# Patient Record
Sex: Female | Born: 1955 | Race: Black or African American | Hispanic: No | Smoking: Never smoker
Health system: Southern US, Community
[De-identification: ages and names within clinical notes are randomized; demographics above are authoritative.]

## PROBLEM LIST (undated history)

## (undated) ENCOUNTER — Emergency Department (HOSPITAL_BASED_OUTPATIENT_CLINIC_OR_DEPARTMENT_OTHER): Admission: EM | Payer: No Typology Code available for payment source | Source: Home / Self Care

## (undated) DIAGNOSIS — E7800 Pure hypercholesterolemia, unspecified: Secondary | ICD-10-CM

## (undated) DIAGNOSIS — I10 Essential (primary) hypertension: Secondary | ICD-10-CM

## (undated) HISTORY — PX: ABDOMINAL HYSTERECTOMY: SHX81

## (undated) HISTORY — PX: CHOLECYSTECTOMY: SHX55

---

## 2015-02-19 ENCOUNTER — Encounter (HOSPITAL_BASED_OUTPATIENT_CLINIC_OR_DEPARTMENT_OTHER): Payer: Self-pay

## 2015-02-19 ENCOUNTER — Emergency Department (HOSPITAL_BASED_OUTPATIENT_CLINIC_OR_DEPARTMENT_OTHER)
Admission: EM | Admit: 2015-02-19 | Discharge: 2015-02-19 | Disposition: A | Discharge status: Home / Self Care | Payer: Medicaid Other | Attending: Emergency Medicine | Admitting: Emergency Medicine

## 2015-02-19 DIAGNOSIS — I10 Essential (primary) hypertension: Secondary | ICD-10-CM

## 2015-02-19 DIAGNOSIS — R51 Headache: Secondary | ICD-10-CM | POA: Diagnosis not present

## 2015-02-19 DIAGNOSIS — Z8639 Personal history of other endocrine, nutritional and metabolic disease: Secondary | ICD-10-CM | POA: Diagnosis not present

## 2015-02-19 HISTORY — DX: Pure hypercholesterolemia, unspecified: E78.00

## 2015-02-19 MED ORDER — LORAZEPAM 1 MG PO TABS
0.5000 mg | ORAL_TABLET | Freq: Once | ORAL | Status: AC
  Administered 2015-02-19: 0.5 mg via ORAL
  Filled 2015-02-19: qty 1

## 2015-02-19 NOTE — ED Provider Notes (Signed)
CSN: [id number]     Arrival date & time 02/19/15  1407 History   First MD Initiated Contact with Patient 02/19/15 1415     Chief Complaint  Patient presents with  . Hypertension     (Consider location/radiation/quality/duration/timing/severity/associated sxs/prior Treatment) The history is provided by the patient and medical records. No language interpreter was used.     Kayla Haynes is a 59 y.o. female  with a hx of high cholesterol presents to the Emergency Department complaining of waxing and waning headache onset 3 days ago.  Pt reports she also had a tight weave in which she took out yesterday but her headache was present this morning. Pt reports headache resolves after taking ibuprofen but returns after the medication wears off.  Pt describes the headache as a tight band around her head. She reports pain is a 0/10 at this time.  She reports when she took her SBP at home it was 227.  Pt reports her medical care is covered at the [place].  She reports she has had HTN on and off for several years.  Nothing makes it better or worse. Pt reports increased stress at home and drinks 2 [place] dew soft drinks per day.   Pt reports attempting to watch her sodium.  She does report eating a fair amount of salted nuts.  Pt denies fever, chills, neck pain, chest pain, SOB, abd pain, N/V/D, weakness, dizziness, syncope, dysuria, hematuria.    Pt reports lab work from the [place] on 02/06/15:  Cholesterol total - 294 Triglycerides - 223 HDL - 40 C-LDL - 209  WBC - 8.34  RBC - 5.32 hgb - 12.4 hct - 39.3 mcv - 73.9 Mch - 23.3 Mchc - 31.6 plt - 357  NA - 142 k - 3.5 CL - 105 Co2 - 26 Ca - 9.5 BUN - 11 Creat - 0.872 EGFR - >60 Glucose - 102 Albumin - 3.8  ALK phos - 109 ALT 16 AST - 11 Dir Bili - <0.1 Bili total - 0.3    Past Medical History  Diagnosis Date  . High cholesterol    Past Surgical History  Procedure Laterality Date  . Abdominal hysterectomy    . Cholecystectomy      No family history on file. Social History  Substance Use Topics  . Smoking status: Never Smoker   . Smokeless tobacco: None  . Alcohol Use: Yes     Comment: rare   OB History    No data available     Review of Systems  Constitutional: Negative for fever, diaphoresis, appetite change, fatigue and unexpected weight change.  HENT: Negative for mouth sores.   Eyes: Negative for visual disturbance.  Respiratory: Negative for cough, chest tightness, shortness of breath and wheezing.   Cardiovascular: Negative for chest pain.  Gastrointestinal: Negative for nausea, vomiting, abdominal pain, diarrhea and constipation.  Endocrine: Negative for polydipsia, polyphagia and polyuria.  Genitourinary: Negative for dysuria, urgency, frequency and hematuria.  Musculoskeletal: Negative for back pain and neck stiffness.  Skin: Negative for rash.  Allergic/Immunologic: Negative for immunocompromised state.  Neurological: Positive for headaches (intermittent). Negative for syncope and light-headedness.  Hematological: Does not bruise/bleed easily.  Psychiatric/Behavioral: Negative for sleep disturbance. The patient is not nervous/anxious.       Allergies  Review of patient's allergies indicates no known allergies.  Home Medications   Prior to Admission medications   Not on File   BP 192/82 mmHg  Pulse 65  Temp(Src) 98.4 F (36.9  C) (Oral)  Resp 18  Ht 5' 3" (1.6 m)  Wt 148 lb (67.132 kg)  BMI 26.22 kg/m2  SpO2 98% Physical Exam  Constitutional: She is oriented to person, place, and time. She appears well-developed and well-nourished. No distress.  HENT:  Head: Normocephalic and atraumatic.  Mouth/Throat: Oropharynx is clear and moist.  Eyes: Conjunctivae and EOM are normal. Pupils are equal, round, and reactive to light. No scleral icterus.  No horizontal, vertical or rotational nystagmus  Neck: Normal range of motion. Neck supple.  Full active and passive ROM without pain No  midline or paraspinal tenderness No nuchal rigidity or meningeal signs  Cardiovascular: Normal rate, regular rhythm, normal heart sounds and intact distal pulses.   No murmur heard. Pulmonary/Chest: Effort normal and breath sounds normal. No respiratory distress. She has no wheezes. She has no rales.  Abdominal: Soft. Bowel sounds are normal. There is no tenderness. There is no rebound and no guarding.  Musculoskeletal: Normal range of motion.  Lymphadenopathy:    She has no cervical adenopathy.  Neurological: She is alert and oriented to person, place, and time. She has normal reflexes. No cranial nerve deficit. She exhibits normal muscle tone. Coordination normal.  Mental Status:  Alert, oriented, thought content appropriate. Speech fluent without evidence of aphasia. Able to follow 2 step commands without difficulty.  Cranial Nerves:  II:  Peripheral visual fields grossly normal, pupils equal, round, reactive to light III,IV, VI: ptosis not present, extra-ocular motions intact bilaterally  V,VII: smile symmetric, facial light touch sensation equal VIII: hearing grossly normal bilaterally  IX,X: midline uvula rise  XI: bilateral shoulder shrug equal and strong XII: midline tongue extension  Motor:  5/5 in upper and lower extremities bilaterally including strong and equal grip strength and dorsiflexion/plantar flexion Sensory: Pinprick and light touch normal in all extremities.  Deep Tendon Reflexes: 2+ and symmetric  Cerebellar: normal finger-to-nose with bilateral upper extremities Gait: normal gait and balance CV: distal pulses palpable throughout   Skin: Skin is warm and dry. No rash noted. She is not diaphoretic.  Psychiatric: She has a normal mood and affect. Her behavior is normal. Judgment and thought content normal.  Nursing note and vitals reviewed.   ED Course  Procedures (including critical care time) Labs Review Labs Reviewed - No data to display  Imaging Review No  results found. I have personally reviewed and evaluated these images and lab results as part of my medical decision-making.   EKG Interpretation None      MDM   Final diagnoses:  Essential hypertension   Allyana Scarberry presents with c/o HTN.  Pt reports intermittent headaches, gradual in nature and without thunderclap, worse headache of her life, vision changes, numbness, tingling or other neurologic symptoms. Patient remains headache free. No concern for subarachnoid hemorrhage.  Patient also without chest pain, shortness of breath, nausea or vomiting.  [facility] without evidence of endorgan damage or that time.  This time I do not feel that further workup including imaging or CT scan would be beneficial. Discussed at length with sodium diet, regular exercise and recheck with primary care within 3 days.  The patient was discussed with Dr. Liu who agrees with the treatment plan and d/c home without labs and imaging.    3:32 PM Patient blood pressure significantly improved after my discussion with her and documented at 164/92. On recheck patient nurse in the room checking manual blood pressures noted at 192/82. Patient   is clearly anxious. She reports she is very nervous about her blood pressure but continues to feel normal. Will give Ativan and reassess.  She remains without evidence of options of urgency or emergency.   4:13 PM Repeat BP 165/86.  Pt reports he is feeling better.  She will follow with Dr. Barbaraann Barthel for further evaluation.  She remains without chest pain, shortness of breath, headache or neurologic symptoms.    Jarrett Soho Islah Eve, PA-C 02/19/15 [place] Liu, MD 02/19/15 2000

## 2015-02-19 NOTE — ED Notes (Signed)
C/o elevated BP x today-states she checks it approx 1 month-states [facility]-was at the [place] 2 days ago-states she not been dx with HTN and no meds

## 2015-02-19 NOTE — Discharge Instructions (Signed)
1. Medications: usual home medications 2. Treatment: rest, drink plenty of fluids, decrease your salt intake, stop drinking mountain dew 3. Follow Up: Please followup with your primary doctor in 3 days for discussion of your diagnoses and further evaluation after today's visit; if you do not have a primary care doctor use the resource guide provided to find one; Please return to the ER for sudden onset headaches, vision changes, chest pain, shortness of breath or other concerning symptoms.   Hypertension Hypertension, commonly called high blood pressure, is when the force of blood pumping through your arteries is too strong. Your arteries are the blood vessels that carry blood from your heart throughout your body. A blood pressure reading consists of a higher number over a lower number, such as 110/72. The higher number (systolic) is the pressure inside your arteries when your heart pumps. The lower number (diastolic) is the pressure inside your arteries when your heart relaxes. Ideally you want your blood pressure below 120/80. Hypertension forces your heart to work harder to pump blood. Your arteries may become narrow or stiff. Having untreated or uncontrolled hypertension can cause heart attack, stroke, kidney disease, and other problems. RISK FACTORS Some risk factors for high blood pressure are controllable. Others are not.  Risk factors you cannot control include:   Race. You may be at higher risk if you are African American.  Age. Risk increases with age.  Gender. Men are at higher risk than women before age 21 years. After age 27, women are at higher risk than men. Risk factors you can control include:  Not getting enough exercise or physical activity.  Being overweight.  Getting too much fat, sugar, calories, or salt in your diet.  Drinking too much alcohol. SIGNS AND SYMPTOMS Hypertension does not usually cause signs or symptoms. Extremely high blood pressure (hypertensive  crisis) may cause headache, anxiety, shortness of breath, and nosebleed. DIAGNOSIS To check if you have hypertension, your health care provider will measure your blood pressure while you are seated, with your arm held at the level of your heart. It should be measured at least twice using the same arm. Certain conditions can cause a difference in blood pressure between your right and left arms. A blood pressure reading that is higher than normal on one occasion does not mean that you need treatment. If it is not clear whether you have high blood pressure, you may be asked to return on a different day to have your blood pressure checked again. Or, you may be asked to monitor your blood pressure at home for 1 or more weeks. TREATMENT Treating high blood pressure includes making lifestyle changes and possibly taking medicine. Living a healthy lifestyle can help lower high blood pressure. You may need to change some of your habits. Lifestyle changes may include:  Following the DASH diet. This diet is high in fruits, vegetables, and whole grains. It is low in salt, red meat, and added sugars.  Keep your sodium intake below 2,300 mg per day.  Getting at least 30-45 minutes of aerobic exercise at least 4 times per week.  Losing weight if necessary.  Not smoking.  Limiting alcoholic beverages.  Learning ways to reduce stress. Your health care provider may prescribe medicine if lifestyle changes are not enough to get your blood pressure under control, and if one of the following is true:  You are 28-61 years of age and your systolic blood pressure is above 140.  You are 58 years of age  or older, and your systolic blood pressure is above 150.  Your diastolic blood pressure is above 90.  You have diabetes, and your systolic blood pressure is over 637 or your diastolic blood pressure is over 90.  You have kidney disease and your blood pressure is above 140/90.  You have heart disease and your  blood pressure is above 140/90. Your personal target blood pressure may vary depending on your medical conditions, your age, and other factors. HOME CARE INSTRUCTIONS  Have your blood pressure rechecked as directed by your health care provider.   Take medicines only as directed by your health care provider. Follow the directions carefully. Blood pressure medicines must be taken as prescribed. The medicine does not work as well when you skip doses. Skipping doses also puts you at risk for problems.  Do not smoke.   Monitor your blood pressure at home as directed by your health care provider. SEEK MEDICAL CARE IF:   You think you are having a reaction to medicines taken.  You have recurrent headaches or feel dizzy.  You have swelling in your ankles.  You have trouble with your vision. SEEK IMMEDIATE MEDICAL CARE IF:  You develop a severe headache or confusion.  You have unusual weakness, numbness, or feel faint.  You have severe chest or abdominal pain.  You vomit repeatedly.  You have trouble breathing. MAKE SURE YOU:   Understand these instructions.  Will watch your condition.  Will get help right away if you are not doing well or get worse.   This information is not intended to replace advice given to you by your health care provider. Make sure you discuss any questions you have with your health care provider.   Document Released: 03/29/2005 Document Revised: 08/13/2014 Document Reviewed: 01/19/2013 Elsevier Interactive Patient Education 2016 [employer].    Emergency Department Resource Guide 1) Find a Doctor and Pay Out of Pocket Although you won't have to find out who is covered by your insurance plan, it is a good idea to ask around and get recommendations. You will then need to call the office and see if the doctor you have chosen will accept you as a new patient and what types of options they offer for patients who are self-pay. Some doctors offer discounts  or will set up payment plans for their patients who do not have insurance, but you will need to ask so you aren't surprised when you get to your appointment.  2) Contact Your Local Health Department Not all health departments have doctors that can see patients for sick visits, but many do, so it is worth a call to see if yours does. If you don't know where your local health department is, you can check in your phone book. The CDC also has a tool to help you locate your state's health department, and many state websites also have listings of all of their local health departments.  3) Find a [facility] If your illness is not likely to be very severe or complicated, you may want to try a walk in clinic. These are popping up all over the country in pharmacies, drugstores, and shopping centers. They're usually staffed by nurse practitioners or physician assistants that have been trained to treat common illnesses and complaints. They're usually fairly quick and inexpensive. However, if you have serious medical issues or chronic medical problems, these are probably not your best option.  No Primary Care Doctor: - Call Health Connect at  [phone] -  they can help you locate a primary care doctor that  accepts your insurance, provides certain services, etc. - Physician Referral Service- [phone]  Chronic Pain Problems: Organization         Address  Phone   Notes  [facility]  [phone] Patients need to be referred by their primary care doctor.   Medication Assistance: Organization         Address  Phone   Notes  [facility] Medication [facility] [address] [place] [address] [place], [place] [address] [phone] --Must be a resident of [facility] -- Must have NO insurance coverage whatsoever (no Medicaid/ Medicare, etc.) -- The pt. MUST have a primary care doctor that directs their care regularly and follows them in the community   [facility]ist  [phone]   [employer]  [phone]    Agencies that provide inexpensive medical care: Organization         Address  Phone   Notes  [place]  [phone]   [facility]    [phone]   [facility] [place], [place] [address] [phone]   [place] [address], [place] [phone]   Planned Parenthood    [phone]   [facility]    [phone]   [place] and [place] [address], [place] Phone:  [phone], Fax:  [phone] Hours of Operation:  9 am - 6 pm, M-F.  Also accepts Medicaid/Medicare and self-pay.  [facility] [place], [address], [place] Phone: [phone], Fax: [phone]. Hours of Operation:  8:30 am - 5:30 pm, M-F.  Also accepts Medicaid and self-pay.  [facility] [address], [place] Phone: [phone]   [place], [place], [place] [phone], Ext. [phone] Mondays & Thursdays: 7-9 AM.  First 15 patients are seen on a first come, first serve basis.    [place] Providers:  Organization         Address  Phone   Notes  [facility] [address], [place] [phone] Also accepts self-pay patients.  [facility] [address], [place]  [phone]   [place], [address], [place] [phone]   [facility] [address] [place] [phone]   Lucianne Lei [address], [place]   [phone] Only accepts [place] Access [place] patients after they have their name applied to their card.   Self-Pay (no insurance) in [facility]:  Organization         Address  Phone   Notes  Sickle Cell Patients, [facility] [place] [phone]   [facility] [place] [phone]   [facility] [place]  [place], [place],  [phone]   [facility]/Dr. Osei-Bonsu  [address], [place] or [address], [place] [phone] Phone number for both [place] and [place] locations is the same.  [facility] [address], [place] [phone]   [facility] [place] or [place]  [facility] [address] [phone]   [facility] [place] [phone], phone; [phone], fax Sees patients 1st and 3rd Saturday of every month.  Must not qualify for public or private insurance (i.e. Medicaid, Medicare, [place] Health Choice, Veterans' Benefits)  Household income should be no more than 200% of the poverty level The clinic cannot treat you if you are pregnant or think you are pregnant  Sexually transmitted diseases are not treated at the clinic.    Dental Care: Organization         Address  Phone  Notes  [facility] Department of [facility] [place] [phone] Accepts children up to age 27 who are enrolled in [place] or [place]; pregnant women with a Medicaid card; and children who have applied for Medicaid or [place] Health Choice, but were declined, whose parents can pay a reduced fee at time of service.  [facility] Department of [facility]  [address], [place] [phone] Accepts children up to age 31 who are enrolled in [place] or [place]; pregnant women with a Medicaid card; and children who have applied for Medicaid or [place] Health Choice, but were declined, whose parents can pay a reduced fee at time of service.  [facility] Adult [facility] Access PROGRAM  [place] [phone] Patients are seen by appointment only. Walk-ins are not accepted. [place] will see patients 73 years of age and older. Monday - Tuesday (8am-5pm) Most Wednesdays (8:30-5pm) $30 per visit, cash only  [facility] Adult Dental Access PROGRAM  [address], [facility] [phone] Patients are seen by appointment only. Walk-ins are not accepted. [place] will see patients 76 years of age and older. One Wednesday Evening (Monthly: Volunteer Based).  $30 per visit, cash only  [place]  [phone] for adults; Children under age 25, call Graduate Pediatric Dentistry at [phone]. Children aged 45-14, please call [phone] to request a pediatric application.  Dental services are provided in all areas of dental care including fillings, crowns and bridges, complete and partial dentures, implants, gum treatment, root canals, and extractions. Preventive care is also provided. Treatment is provided to both adults and children. Patients are selected via a lottery and there is often a waiting list.   [facility] [address] [place]  [phone] [email or web address]   [facility] [address] [place], [place] [phone], Ext. [phone] Second and Fourth Thursday of each month, opens at 6:30 AM; Clinic ends at 9 AM.  Patients are seen on a first-come first-served basis, and a limited number are seen during each clinic.   [facility]  [address] Hillard Danker [place], [place] [phone]   Eligibility Requirements You must have lived in [place], [place], or [place] counties for at least the last three months.   You cannot be eligible for state or federal sponsored [employer], including [employer], [place], or [employer].   You generally cannot be eligible for healthcare insurance through your employer.    How to apply: Eligibility screenings are held every Tuesday and Wednesday afternoon  from 1:00 pm until 4:00 pm. You do not need an appointment for the interview!  [facility] [address], [place], [place]   [place]  [place]  Health Department  [place]  [phone]    Behavioral Health Resources in the Community: Intensive Outpatient Programs Organization         Address  Phone  Notes  [facility] [place]. [address] [place], [place] [phone]   [facility] Outpatient [address], [place], [place]   ADS: Alcohol & Drug Svcs [address] [place], [place]   [place] [address],  [place], [place] or [phone]   Substance Abuse Resources Organization         Address  Phone  Notes  Alcohol and Drug Services  [phone]   [place]  [phone]   [facility] [place]   Chinita Pester  [phone]   Residential & Outpatient Substance Abuse Program  [phone]   Psychological Services Organization         Address  Phone  Notes  [facility]  [place]  [phone]   [place] [address] [place] or [phone]    Mobile Crisis Teams Organization         Address  Phone  Notes  [facility], Mobile Crisis Care Unit  [phone]   [facility]  [address] [place], [place]   [place] [address] [place] [phone]    Self-Help/Support Groups Organization         Address  Phone             Notes  [place]. of [place] - variety of support groups  [place] Call for more information  Narcotics Anonymous (NA), [facility]aring [facility] [address], [employer] [place]  2 meetings at this location   Materials engineer         Address  Phone  Notes  [facility] [place],    [place]  [phone]   [facility]  [address], [place] [address], [place], [place]   [place], [place] [phone] Admissions: 8am-3pm M-F  Incentives Substance [place] [address]    [place], [place] [phone]   [facility] [address] [place], [place], [place]   [facility] [address],  [place], [place]   [facility] Programs - Intensive Outpatient [address] Kristeen [address], [place], [place]   [facility] ([place]  [place], [place] [phone] or [phone]   [facility] Services (RTS) [address], [place], [place] Accepts Medicaid  [facility] [place] [address]  [place] [phone] Substance Abuse/Addiction Treatment   [facility] Organization         Address  Phone  Notes  [facility]  [phone]   Domenic Schwab, PhD [address] Arlis Porta [place], [place]   [phone] or [phone]   [place], [place] [phone]   [place] [address], [place], [place] [phone] Insurance/Medicaid/sponsorship through [employer] and Families [address] [place] [address]  [place], [place] [phone] [place], [place] [phone]    Dr. Adele Schilder  [phone]   [facility] Dept. 1) [address] [place] 2) [place] 3)  [address], [place] [phone]  [phone]   [place] [phone] or [phone] (After Hours)

## 2017-09-01 ENCOUNTER — Other Ambulatory Visit: Payer: Self-pay

## 2017-09-01 ENCOUNTER — Emergency Department (HOSPITAL_BASED_OUTPATIENT_CLINIC_OR_DEPARTMENT_OTHER)
Admission: EM | Admit: 2017-09-01 | Discharge: 2017-09-02 | Disposition: A | Discharge status: Home / Self Care | Payer: Medicaid Other | Attending: Emergency Medicine | Admitting: Emergency Medicine

## 2017-09-01 ENCOUNTER — Encounter (HOSPITAL_BASED_OUTPATIENT_CLINIC_OR_DEPARTMENT_OTHER): Payer: Self-pay

## 2017-09-01 ENCOUNTER — Emergency Department (HOSPITAL_BASED_OUTPATIENT_CLINIC_OR_DEPARTMENT_OTHER): Payer: Medicaid Other

## 2017-09-01 DIAGNOSIS — R197 Diarrhea, unspecified: Secondary | ICD-10-CM | POA: Diagnosis present

## 2017-09-01 DIAGNOSIS — I10 Essential (primary) hypertension: Secondary | ICD-10-CM | POA: Diagnosis not present

## 2017-09-01 DIAGNOSIS — K529 Noninfective gastroenteritis and colitis, unspecified: Secondary | ICD-10-CM

## 2017-09-01 HISTORY — DX: Essential (primary) hypertension: I10

## 2017-09-01 LAB — COMPREHENSIVE METABOLIC PANEL
ALK PHOS: 87 U/L (ref 38–126)
ALT: 22 U/L (ref 14–54)
ANION GAP: 15 (ref 5–15)
AST: 28 U/L (ref 15–41)
Albumin: 4.8 g/dL (ref 3.5–5.0)
BILIRUBIN TOTAL: 0.6 mg/dL (ref 0.3–1.2)
BUN: 14 mg/dL (ref 6–20)
CALCIUM: 9.9 mg/dL (ref 8.9–10.3)
CO2: 21 mmol/L — ABNORMAL LOW (ref 22–32)
CREATININE: 0.84 mg/dL (ref 0.44–1.00)
Chloride: 99 mmol/L — ABNORMAL LOW (ref 101–111)
Glucose, Bld: 200 mg/dL — ABNORMAL HIGH (ref 65–99)
Potassium: 3.6 mmol/L (ref 3.5–5.1)
Sodium: 135 mmol/L (ref 135–145)
TOTAL PROTEIN: 8.6 g/dL — AB (ref 6.5–8.1)

## 2017-09-01 LAB — CBC
HCT: 35.9 % — ABNORMAL LOW (ref 36.0–46.0)
Hemoglobin: 12.5 g/dL (ref 12.0–15.0)
MCH: 24.8 pg — AB (ref 26.0–34.0)
MCHC: 34.8 g/dL (ref 30.0–36.0)
MCV: 71.2 fL — ABNORMAL LOW (ref 78.0–100.0)
PLATELETS: 361 10*3/uL (ref 150–400)
RBC: 5.04 MIL/uL (ref 3.87–5.11)
RDW: 14.2 % (ref 11.5–15.5)
WBC: 8 10*3/uL (ref 4.0–10.5)

## 2017-09-01 LAB — URINALYSIS, MICROSCOPIC (REFLEX)

## 2017-09-01 LAB — URINALYSIS, ROUTINE W REFLEX MICROSCOPIC
Glucose, UA: NEGATIVE mg/dL
Ketones, ur: 15 mg/dL — AB
NITRITE: NEGATIVE
PROTEIN: 100 mg/dL — AB
Specific Gravity, Urine: >1.03 — ABNORMAL HIGH (ref 1.005–1.030)
pH: 6 (ref 5.0–8.0)

## 2017-09-01 LAB — LIPASE, BLOOD: Lipase: 26 U/L (ref 11–51)

## 2017-09-01 MED ORDER — PROMETHAZINE HCL 25 MG/ML IJ SOLN
12.5000 mg | Freq: Once | INTRAMUSCULAR | Status: AC
  Administered 2017-09-01: 12.5 mg via INTRAVENOUS
  Filled 2017-09-01: qty 1

## 2017-09-01 MED ORDER — SODIUM CHLORIDE 0.9 % IV BOLUS
1000.0000 mL | Freq: Once | INTRAVENOUS | Status: AC
  Administered 2017-09-01: 1000 mL via INTRAVENOUS

## 2017-09-01 MED ORDER — FENTANYL CITRATE (PF) 100 MCG/2ML IJ SOLN
50.0000 ug | Freq: Once | INTRAMUSCULAR | Status: AC
  Administered 2017-09-01: 50 ug via INTRAVENOUS
  Filled 2017-09-01: qty 2

## 2017-09-01 MED ORDER — ONDANSETRON HCL 4 MG/2ML IJ SOLN
4.0000 mg | Freq: Once | INTRAMUSCULAR | Status: AC | PRN
  Administered 2017-09-01: 4 mg via INTRAVENOUS
  Filled 2017-09-01: qty 2

## 2017-09-01 MED ORDER — GI COCKTAIL ~~LOC~~
30.0000 mL | Freq: Once | ORAL | Status: AC
  Administered 2017-09-01: 30 mL via ORAL
  Filled 2017-09-01: qty 30

## 2017-09-01 NOTE — ED Notes (Signed)
Attempted x 1 to obtain IV access; unable to obtain.

## 2017-09-01 NOTE — ED Triage Notes (Signed)
Pt c/o diarrhea after eating shrimp last night

## 2017-09-01 NOTE — ED Provider Notes (Signed)
[facility] EMERGENCY DEPARTMENT Provider Note   CSN: [id number] Arrival date & time: 09/01/17  1754     History   Chief Complaint Chief Complaint  Patient presents with  . Diarrhea    HPI Kayla Haynes is a 62 y.o. female.  Patient states she developed nausea, vomiting, and diarrhea after consuming shrimp last night. She is complaining of diffuse, non-localized abdominal pain and cramping. Frequent episodes of diarrhea and vomiting.   Abdominal Pain   This is a new problem. The current episode started 12 to 24 hours ago. The problem occurs constantly. The problem has been gradually worsening. The pain is associated with suspicious food intake. The pain is located in the generalized abdominal region. The pain is moderate. Associated symptoms include belching, diarrhea, nausea and vomiting. Pertinent negatives include fever.    Past Medical History:  Diagnosis Date  . High cholesterol   . Hypertension     There are no active problems to display for this patient.   Past Surgical History:  Procedure Laterality Date  . ABDOMINAL HYSTERECTOMY    . CHOLECYSTECTOMY       OB History   None      Home Medications    Prior to Admission medications   Not on File    Family History No family history on file.  Social History Social History   Tobacco Use  . Smoking status: Never Smoker  Substance Use Topics  . Alcohol use: Yes    Comment: rare  . Drug use: Yes    Types: Marijuana     Allergies   Patient has no known allergies.   Review of Systems Review of Systems  Constitutional: Negative for fever.  Gastrointestinal: Positive for abdominal pain, diarrhea, nausea and vomiting.  All other systems reviewed and are negative.    Physical Exam Updated Vital Signs BP (!) 148/118 (BP Location: Left Arm)   Pulse 82   Temp 98.3 F (36.8 C) (Oral)   Resp 20   Ht 5\' 3"  (1.6 m)   Wt 53.6 kg (118 lb 1.6 oz)   SpO2 100%   BMI 20.92 kg/m    Physical Exam  Constitutional: She is oriented to person, place, and time. She appears well-developed and well-nourished. She appears distressed.  HENT:  Head: Normocephalic.  Eyes: Conjunctivae are normal.  Neck: Neck supple.  Cardiovascular: Normal rate and regular rhythm.  Pulmonary/Chest: Effort normal and breath sounds normal.  Abdominal: Soft. There is tenderness.  Musculoskeletal: Normal range of motion.  Neurological: She is alert and oriented to person, place, and time.  Skin: Skin is warm and dry.  Psychiatric: She has a normal mood and affect.  Nursing note and vitals reviewed.    ED Treatments / Results  Labs (all labs ordered are listed, but only abnormal results are displayed) Labs Reviewed  COMPREHENSIVE METABOLIC PANEL - Abnormal; Notable for the following components:      Result Value   Chloride 99 (*)    CO2 21 (*)    Glucose, Bld 200 (*)    Total Protein 8.6 (*)    All other components within normal limits  CBC - Abnormal; Notable for the following components:   HCT 35.9 (*)    MCV 71.2 (*)    MCH 24.8 (*)    All other components within normal limits  URINALYSIS, ROUTINE W REFLEX MICROSCOPIC - Abnormal; Notable for the following components:   APPearance CLOUDY (*)    Specific Gravity, Urine >1.030 (*)  Hgb urine dipstick SMALL (*)    Bilirubin Urine SMALL (*)    Ketones, ur 15 (*)    Protein, ur 100 (*)    Leukocytes, UA SMALL (*)    All other components within normal limits  URINALYSIS, MICROSCOPIC (REFLEX) - Abnormal; Notable for the following components:   Bacteria, UA FEW (*)    All other components within normal limits  LIPASE, BLOOD    EKG None  Radiology No results found.  Procedures Procedures (including critical care time)  Medications Ordered in ED Medications  sodium chloride 0.9 % bolus 1,000 mL (has no administration in time range)  promethazine (PHENERGAN) injection 12.5 mg (has no administration in time range)   ondansetron (ZOFRAN) injection 4 mg (4 mg Intravenous Given 09/01/17 2032)  gi cocktail (Maalox,Lidocaine,Donnatal) (30 mLs Oral Given 09/01/17 2041)     Initial Impression / Assessment and Plan / ED Course  I have reviewed the triage vital signs and the nursing notes.  Pertinent labs & imaging results that were available during my care of the patient were reviewed by me and considered in my medical decision making (see chart for details).     Patient with symptoms consistent with gastroenteritis.  Vitals are stable, no fever.  No signs of dehydration, tolerating PO fluids > 6 oz.  Lungs are clear.  No focal abdominal pain, no concern for appendicitis, cholecystitis, pancreatitis, ruptured viscus, UTI, kidney stone, or any other abdominal etiology.  Supportive therapy indicated with return if symptoms worsen.  Patient counseled.  Final Clinical Impressions(s) / ED Diagnoses   Final diagnoses:  Gastroenteritis    ED Discharge Orders        Ordered    dicyclomine (BENTYL) 20 MG tablet  2 times daily     09/02/17 0011    ondansetron (ZOFRAN ODT) 4 MG disintegrating tablet     09/02/17 0011       Etta Quill, NP 09/02/17 0023    Tegeler, Gwenyth Allegra, MD 09/02/17 [phone]

## 2017-09-02 MED ORDER — DICYCLOMINE HCL 10 MG PO CAPS
10.0000 mg | ORAL_CAPSULE | Freq: Once | ORAL | Status: AC
  Administered 2017-09-02: 10 mg via ORAL
  Filled 2017-09-02: qty 1

## 2017-09-02 MED ORDER — DICYCLOMINE HCL 20 MG PO TABS: 20.0000 mg | ORAL_TABLET | Freq: Two times a day (BID) | ORAL | 0 refills | Status: DC

## 2017-09-02 MED ORDER — ONDANSETRON 4 MG PO TBDP: ORAL_TABLET | ORAL | 0 refills | Status: DC

## 2017-09-04 ENCOUNTER — Encounter (HOSPITAL_BASED_OUTPATIENT_CLINIC_OR_DEPARTMENT_OTHER): Payer: Self-pay | Admitting: Emergency Medicine

## 2017-09-04 ENCOUNTER — Emergency Department (HOSPITAL_BASED_OUTPATIENT_CLINIC_OR_DEPARTMENT_OTHER): Payer: Medicaid Other

## 2017-09-04 ENCOUNTER — Other Ambulatory Visit: Payer: Self-pay

## 2017-09-04 ENCOUNTER — Emergency Department (HOSPITAL_BASED_OUTPATIENT_CLINIC_OR_DEPARTMENT_OTHER)
Admission: EM | Admit: 2017-09-04 | Discharge: 2017-09-04 | Disposition: A | Discharge status: Home / Self Care | Payer: Medicaid Other | Attending: Emergency Medicine | Admitting: Emergency Medicine

## 2017-09-04 DIAGNOSIS — R197 Diarrhea, unspecified: Secondary | ICD-10-CM

## 2017-09-04 DIAGNOSIS — I10 Essential (primary) hypertension: Secondary | ICD-10-CM | POA: Insufficient documentation

## 2017-09-04 DIAGNOSIS — R1030 Lower abdominal pain, unspecified: Secondary | ICD-10-CM | POA: Diagnosis present

## 2017-09-04 DIAGNOSIS — Z79899 Other long term (current) drug therapy: Secondary | ICD-10-CM | POA: Diagnosis not present

## 2017-09-04 DIAGNOSIS — R1013 Epigastric pain: Secondary | ICD-10-CM | POA: Diagnosis not present

## 2017-09-04 DIAGNOSIS — E860 Dehydration: Secondary | ICD-10-CM

## 2017-09-04 DIAGNOSIS — G4700 Insomnia, unspecified: Secondary | ICD-10-CM | POA: Diagnosis not present

## 2017-09-04 LAB — URINALYSIS, ROUTINE W REFLEX MICROSCOPIC
Bilirubin Urine: NEGATIVE
Glucose, UA: NEGATIVE mg/dL
Ketones, ur: NEGATIVE mg/dL
Nitrite: NEGATIVE
Protein, ur: NEGATIVE mg/dL
Specific Gravity, Urine: 1.01 (ref 1.005–1.030)
pH: 5.5 (ref 5.0–8.0)

## 2017-09-04 LAB — CBC WITH DIFFERENTIAL/PLATELET
Basophils Absolute: 0 10*3/uL (ref 0.0–0.1)
Basophils Relative: 0 %
Eosinophils Absolute: 0 10*3/uL (ref 0.0–0.7)
Eosinophils Relative: 0 %
HCT: 37.6 % (ref 36.0–46.0)
Hemoglobin: 12.9 g/dL (ref 12.0–15.0)
Lymphocytes Relative: 21 %
Lymphs Abs: 2.2 10*3/uL (ref 0.7–4.0)
MCH: 24.2 pg — ABNORMAL LOW (ref 26.0–34.0)
MCHC: 34.3 g/dL (ref 30.0–36.0)
MCV: 70.5 fL — ABNORMAL LOW (ref 78.0–100.0)
Monocytes Absolute: 1.6 10*3/uL — ABNORMAL HIGH (ref 0.1–1.0)
Monocytes Relative: 15 %
Neutro Abs: 6.8 10*3/uL (ref 1.7–7.7)
Neutrophils Relative %: 64 %
Platelets: 371 10*3/uL (ref 150–400)
RBC: 5.33 MIL/uL — ABNORMAL HIGH (ref 3.87–5.11)
RDW: 13.9 % (ref 11.5–15.5)
WBC: 10.6 10*3/uL — ABNORMAL HIGH (ref 4.0–10.5)

## 2017-09-04 LAB — COMPREHENSIVE METABOLIC PANEL
ALT: 21 U/L (ref 14–54)
AST: 29 U/L (ref 15–41)
Albumin: 5.1 g/dL — ABNORMAL HIGH (ref 3.5–5.0)
Alkaline Phosphatase: 89 U/L (ref 38–126)
Anion gap: 15 (ref 5–15)
BUN: 34 mg/dL — ABNORMAL HIGH (ref 6–20)
CO2: 25 mmol/L (ref 22–32)
Calcium: 10.2 mg/dL (ref 8.9–10.3)
Chloride: 93 mmol/L — ABNORMAL LOW (ref 101–111)
Creatinine, Ser: 1.53 mg/dL — ABNORMAL HIGH (ref 0.44–1.00)
GFR calc Af Amer: 42 mL/min — ABNORMAL LOW (ref >60)
GFR calc non Af Amer: 36 mL/min — ABNORMAL LOW (ref >60)
Glucose, Bld: 153 mg/dL — ABNORMAL HIGH (ref 65–99)
Potassium: 2.9 mmol/L — ABNORMAL LOW (ref 3.5–5.1)
Sodium: 133 mmol/L — ABNORMAL LOW (ref 135–145)
Total Bilirubin: 1 mg/dL (ref 0.3–1.2)
Total Protein: 8.7 g/dL — ABNORMAL HIGH (ref 6.5–8.1)

## 2017-09-04 LAB — URINALYSIS, MICROSCOPIC (REFLEX)

## 2017-09-04 LAB — MAGNESIUM: Magnesium: 2.2 mg/dL (ref 1.7–2.4)

## 2017-09-04 LAB — LIPASE, BLOOD: Lipase: 31 U/L (ref 11–51)

## 2017-09-04 MED ORDER — IOPAMIDOL (ISOVUE-300) INJECTION 61%
100.0000 mL | Freq: Once | INTRAVENOUS | Status: AC | PRN
  Administered 2017-09-04: 100 mL via INTRAVENOUS

## 2017-09-04 MED ORDER — LORAZEPAM 1 MG PO TABS: 1.0000 mg | ORAL_TABLET | Freq: Every day | ORAL | 0 refills | Status: DC

## 2017-09-04 MED ORDER — KETOROLAC TROMETHAMINE 30 MG/ML IJ SOLN
30.0000 mg | Freq: Once | INTRAMUSCULAR | Status: AC
  Administered 2017-09-04: 30 mg via INTRAVENOUS
  Filled 2017-09-04: qty 1

## 2017-09-04 MED ORDER — SODIUM CHLORIDE 0.9 % IV BOLUS: 1000.0000 mL | Freq: Once | INTRAVENOUS | Status: DC

## 2017-09-04 MED ORDER — ONDANSETRON 4 MG PO TBDP: 4.0000 mg | ORAL_TABLET | ORAL | 0 refills | Status: DC | PRN

## 2017-09-04 MED ORDER — SODIUM CHLORIDE 0.9 % IV BOLUS
1000.0000 mL | Freq: Once | INTRAVENOUS | Status: AC
  Administered 2017-09-04: 1000 mL via INTRAVENOUS

## 2017-09-04 MED ORDER — OMEPRAZOLE 20 MG PO CPDR: 20.0000 mg | DELAYED_RELEASE_CAPSULE | Freq: Every day | ORAL | 0 refills | Status: DC

## 2017-09-04 MED ORDER — LOPERAMIDE HCL 2 MG PO CAPS: 2.0000 mg | ORAL_CAPSULE | Freq: Four times a day (QID) | ORAL | 0 refills | Status: DC | PRN

## 2017-09-04 NOTE — ED Provider Notes (Signed)
[facility] Provider Note   CSN: [id number] Arrival date & time: 09/04/17  1839     History   Chief Complaint Chief Complaint  Patient presents with  . Abdominal Pain    HPI Kayla Haynes is a 62 y.o. female with history of hypertension, hypercholesterolemia who presents with a 4-day history of abdominal pain and diarrhea.  Her diarrhea is nonbloody.  She is unable to eat anything.  She reports her symptoms started after eating shrimp the night prior.  She was evaluated the next day and sent home with diagnosis of gastroenteritis.  Patient continues to have symptoms.  She denies any vomiting or fever.  She reports her abdominal pain is crampy and severe.  She has been taking Bentyl and Zofran at home without significant relief.  HPI  Past Medical History:  Diagnosis Date  . High cholesterol   . Hypertension     There are no active problems to display for this patient.   Past Surgical History:  Procedure Laterality Date  . ABDOMINAL HYSTERECTOMY    . CHOLECYSTECTOMY       OB History   None      Home Medications    Prior to Admission medications   Medication Sig Start Date End Date Taking? Authorizing Provider  dicyclomine (BENTYL) 20 MG tablet Take 1 tablet (20 mg total) by mouth 2 (two) times daily. 09/02/17   Etta Quill, NP  loperamide (IMODIUM) 2 MG capsule Take 1 capsule (2 mg total) by mouth 4 (four) times daily as needed for diarrhea or loose stools. 09/04/17   Charlesetta Shanks, MD  LORazepam (ATIVAN) 1 MG tablet Take 1 tablet (1 mg total) by mouth at bedtime. Take at nighttime as needed for sleep. 09/04/17   Charlesetta Shanks, MD  omeprazole (PRILOSEC) 20 MG capsule Take 1 capsule (20 mg total) by mouth daily. 09/04/17   Charlesetta Shanks, MD  ondansetron (ZOFRAN ODT) 4 MG disintegrating tablet 4mg  ODT q6 hours prn nausea/vomit 09/02/17   Etta Quill, NP  ondansetron (ZOFRAN ODT) 4 MG disintegrating tablet Take 1 tablet (4 mg total) by  mouth every 4 (four) hours as needed for nausea or vomiting. 09/04/17   Charlesetta Shanks, MD    Family History History reviewed. No pertinent family history.  Social History Social History   Tobacco Use  . Smoking status: Never Smoker  . Smokeless tobacco: Never Used  Substance Use Topics  . Alcohol use: Yes    Comment: rare  . Drug use: Yes    Types: Marijuana     Allergies   Patient has no known allergies.   Review of Systems Review of Systems  Constitutional: Negative for chills and fever.  HENT: Negative for facial swelling and sore throat.   Respiratory: Negative for shortness of breath.   Cardiovascular: Negative for chest pain.  Gastrointestinal: Positive for abdominal pain and diarrhea. Negative for blood in stool, nausea and vomiting.  Genitourinary: Negative for dysuria.  Musculoskeletal: Negative for back pain.  Skin: Negative for rash and wound.  Neurological: Negative for headaches.  Psychiatric/Behavioral: The patient is not nervous/anxious.      Physical Exam Updated Vital Signs BP (!) 156/58   Pulse 62   Temp 98.3 F (36.8 C) (Oral)   Resp 20   Ht 5\' 3"  (1.6 m)   Wt 53.5 kg (118 lb)   SpO2 100%   BMI 20.90 kg/m   Physical Exam  Constitutional: She appears well-developed and well-nourished. No distress.  Patien with head covered with blanket until I asked her to remove it for my exam  HENT:  Head: Normocephalic and atraumatic.  Mouth/Throat: Oropharynx is clear and moist. No oropharyngeal exudate.  Eyes: Pupils are equal, round, and reactive to light. Conjunctivae are normal. Right eye exhibits no discharge. Left eye exhibits no discharge. No scleral icterus.  Neck: Normal range of motion. Neck supple. No thyromegaly present.  Cardiovascular: Normal rate, regular rhythm, normal heart sounds and intact distal pulses. Exam reveals no gallop and no friction rub.  No murmur heard. Pulmonary/Chest: Effort normal and breath sounds normal. No  stridor. No respiratory distress. She has no wheezes. She has no rales.  Abdominal: Soft. Bowel sounds are normal. She exhibits no distension. There is tenderness in the periumbilical area. There is no rigidity, no rebound, no guarding, no CVA tenderness, no tenderness at McBurney's point and negative Murphy's sign.  Musculoskeletal: She exhibits no edema.  Lymphadenopathy:    She has no cervical adenopathy.  Neurological: She is alert. Coordination normal.  Skin: Skin is warm and dry. No rash noted. She is not diaphoretic. No pallor.  Psychiatric: She has a normal mood and affect.  Nursing note and vitals reviewed.    ED Treatments / Results  Labs (all labs ordered are listed, but only abnormal results are displayed) Labs Reviewed  COMPREHENSIVE METABOLIC PANEL - Abnormal; Notable for the following components:      Result Value   Sodium 133 (*)    Potassium 2.9 (*)    Chloride 93 (*)    Glucose, Bld 153 (*)    BUN 34 (*)    Creatinine, Ser 1.53 (*)    Total Protein 8.7 (*)    Albumin 5.1 (*)    GFR calc non Af Amer 36 (*)    GFR calc Af Amer 42 (*)    All other components within normal limits  CBC WITH DIFFERENTIAL/PLATELET - Abnormal; Notable for the following components:   WBC 10.6 (*)    RBC 5.33 (*)    MCV 70.5 (*)    MCH 24.2 (*)    Monocytes Absolute 1.6 (*)    All other components within normal limits  URINALYSIS, ROUTINE W REFLEX MICROSCOPIC - Abnormal; Notable for the following components:   APPearance HAZY (*)    Hgb urine dipstick TRACE (*)    Leukocytes, UA TRACE (*)    All other components within normal limits  URINALYSIS, MICROSCOPIC (REFLEX) - Abnormal; Notable for the following components:   Bacteria, UA FEW (*)    Trichomonas, UA PRESENT (*)    All other components within normal limits  GASTROINTESTINAL PANEL BY PCR, STOOL (REPLACES STOOL CULTURE)  C DIFFICILE QUICK SCREEN W PCR REFLEX  LIPASE, BLOOD  MAGNESIUM    EKG None  Radiology Ct  Abdomen Pelvis W Contrast  Result Date: 09/04/2017 CLINICAL DATA:  Acute onset of mid abdominal pain and vomiting. EXAM: CT ABDOMEN AND PELVIS WITH CONTRAST TECHNIQUE: Multidetector CT imaging of the abdomen and pelvis was performed using the standard protocol following bolus administration of intravenous contrast. CONTRAST:  189mL ISOVUE-300 IOPAMIDOL (ISOVUE-300) INJECTION 61% COMPARISON:  Abdominal radiograph performed 09/01/2017 FINDINGS: Lower chest: The visualized lung bases are grossly clear. The visualized portions of the mediastinum are unremarkable. Hepatobiliary: The liver is unremarkable in appearance. The patient is status post cholecystectomy, with clips noted at the gallbladder fossa. The common bile duct remains normal in caliber. Pancreas: The pancreas is within normal limits. Spleen: The spleen is  unremarkable in appearance. Adrenals/Urinary Tract: The adrenal glands are unremarkable in appearance. A small left renal cyst is seen. Left renal stones measure up to 4 mm in size. There is no evidence of hydronephrosis. No renal or ureteral stones are identified. No perinephric stranding is seen. Stomach/Bowel: The stomach is unremarkable in appearance. The small bowel is within normal limits. The appendix is normal in caliber, without evidence of appendicitis. The colon is unremarkable in appearance. Vascular/Lymphatic: Scattered calcification is seen along the abdominal aorta and its branches, with mild mural thrombus. There is likely mild luminal narrowing along the right common iliac artery. There is question of a mild focal outpouching of the abdominal aorta at the level of the third segment of the duodenum. The external iliac arteries appear somewhat diminutive bilaterally. The inferior vena cava is grossly unremarkable. No retroperitoneal lymphadenopathy is seen. No pelvic sidewall lymphadenopathy is identified. Reproductive: The bladder is mildly distended and grossly unremarkable. The patient  is status post hysterectomy. No suspicious adnexal masses are seen. Other: No additional soft tissue abnormalities are seen. Musculoskeletal: No acute osseous abnormalities are identified. Vacuum phenomenon is noted at L3-L4. The visualized musculature is unremarkable in appearance. IMPRESSION: 1. No acute abnormality seen to explain the patient's symptoms. 2. Scattered aortic atherosclerosis and mild mural thrombus along the abdominal aorta. Likely mild luminal narrowing along the right common iliac artery. Question of mild focal outpouching of the abdominal aorta at the level of the third segment of the duodenum, without evidence of aneurysmal dilatation. 3. External iliac arteries appear somewhat diminutive bilaterally. 4. Small left renal cyst noted. Left renal stones measure up to 4 mm in size. Electronically Signed   By: Garald Balding M.D.   On: 09/04/2017 21:13    Procedures Procedures (including critical care time)  Medications Ordered in ED Medications  sodium chloride 0.9 % bolus 1,000 mL (has no administration in time range)  sodium chloride 0.9 % bolus 1,000 mL (0 mLs Intravenous Stopped 09/04/17 2242)  ketorolac (TORADOL) 30 MG/ML injection 30 mg (30 mg Intravenous Given 09/04/17 2012)  iopamidol (ISOVUE-300) 61 % injection 100 mL (100 mLs Intravenous Contrast Given 09/04/17 2025)     Initial Impression / Assessment and Plan / ED Course  I have reviewed the triage vital signs and the nursing notes.  Pertinent labs & imaging results that were available during my care of the patient were reviewed by me and considered in my medical decision making (see chart for details).     Patient presenting with presumed infectious diarrhea from eating shrimp 4 days ago.  CT abdomen pelvis is negative for findings to explain the patient's symptoms.  CBC shows WBC 10.6.  CMP shows sodium 133, potassium 2.9, chloride 93, BUN 34, creatinine 1.53.  Magnesium 2.2. Lipase 31.  Patient does have AKI and  was given 2 L normal saline in the ED.  She is tolerating oral fluids.  Patient evaluated by Dr. Vallery Ridge who discharged the patient home with Imodium, Prilosec, Zofran, and short course of Ativan to help with patient's insomnia.  Patient to follow-up with PCP.  Patient vitals stable throughout ED course and discharged in satisfactory condition.  I have called the patient without answer to let her know about positive trichomonas in UA. I have asked Aretta Nip, RN for assistance with contacting the patient and arranging treatment.  Final Clinical Impressions(s) / ED Diagnoses   Final diagnoses:  Diarrhea of presumed infectious origin  Dehydration  Insomnia, unspecified type  Epigastric pain  ED Discharge Orders        Ordered    omeprazole (PRILOSEC) 20 MG capsule  Daily     09/04/17 2232    ondansetron (ZOFRAN ODT) 4 MG disintegrating tablet  Every 4 hours PRN     09/04/17 2232    LORazepam (ATIVAN) 1 MG tablet  Daily at bedtime     09/04/17 2232    loperamide (IMODIUM) 2 MG capsule  4 times daily PRN     09/04/17 2232       Frederica Kuster, PA-C 09/07/17 7517    Charlesetta Shanks, MD 09/13/17 2348

## 2017-09-04 NOTE — ED Provider Notes (Signed)
Medical screening examination/treatment/procedure(s) were conducted as a shared visit with non-physician practitioner(s) and myself.  I personally evaluated the patient during the encounter.  None  Patient started getting diarrhea after eating shrimp 4 nights ago.  She reports she is having cramping lower abdominal pain.  She was evaluated and diagnosed with gastroenteritis.  She reports that the cramping and diarrhea is persisting.  Patient is alert and nontoxic.  Mental status clear.  No respiratory distress.  Abdomen is soft with mild diffuse lower discomfort no guarding no peritoneal signs  CT scan obtained does not show acute findings.  Still presumed to be secondary to probable food or infectious etiology.  Patient had mild dehydration and is rehydrated in the emergency department.  Also mild hypokalemia.  Vital signs are stable patient has nonsurgical abdominal exam.  Plan will be for continued hydration and Imodium as needed.  She is also advised that she has had trouble sleeping for the past 3 nights.  She reports she is felt very agitated.  Patient given Ativan to take for very short-term.  She is to follow-up with her PCP.   Charlesetta Shanks, MD 09/05/17 [phone]

## 2017-09-04 NOTE — ED Triage Notes (Signed)
Patient states that she was seen for food poisioning or gastritis recently " and I aint no better" - patient is rocking back and forth in the wheelchair in triage. She denies any N/V/D reports that she just has pain

## 2017-09-04 NOTE — ED Notes (Signed)
patient asking for ice chips and water, as well as an emisis bag because she feels like she may throw up

## 2017-09-04 NOTE — ED Notes (Signed)
Pt given d/c instructions as per chart. Rx x 4. Verbalizes understanding. No questions.

## 2017-09-07 ENCOUNTER — Telehealth (HOSPITAL_COMMUNITY): Payer: Self-pay

## 2017-09-07 NOTE — ED Notes (Signed)
09/07/2017,  Spoke with pt. Reported to her results of her urine specimen.   All Questions answered.  RX called into the pharmacy.  Flagyl 2g po X 1 dose, no refills,  Eliezer Mccoy, PA-C

## 2017-09-13 DIAGNOSIS — I10 Essential (primary) hypertension: Secondary | ICD-10-CM | POA: Insufficient documentation

## 2017-09-13 DIAGNOSIS — E7800 Pure hypercholesterolemia, unspecified: Secondary | ICD-10-CM | POA: Insufficient documentation

## 2019-07-20 IMAGING — CT CT ABD-PELV W/ CM
2 of 5 series · 15 of 46 positions shown, 17 images · IV contrast (APPLIED)
Comparison: Abdominal radiograph performed 09/01/2017

CLINICAL DATA: Acute onset of mid abdominal pain and vomiting.

EXAM:
CT ABDOMEN AND PELVIS WITH CONTRAST
TECHNIQUE: Multidetector CT imaging of the abdomen and pelvis was performed
using the standard protocol following bolus administration of
intravenous contrast.
CONTRAST:  100mL R09ZP9-LGG IOPAMIDOL (R09ZP9-LGG) INJECTION 61%

[Series 2: axial st · axial · 0.58mm/px · z∈[-427,-52]mm · 12 of 85 slices shown, 14 images]
[im 5/85  soft-tissue]
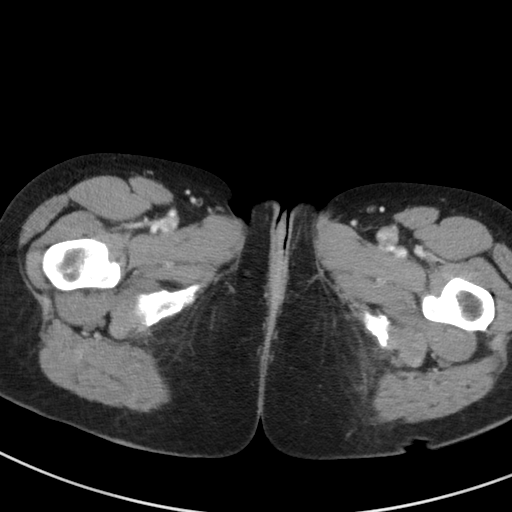
[im 5/85  bone]
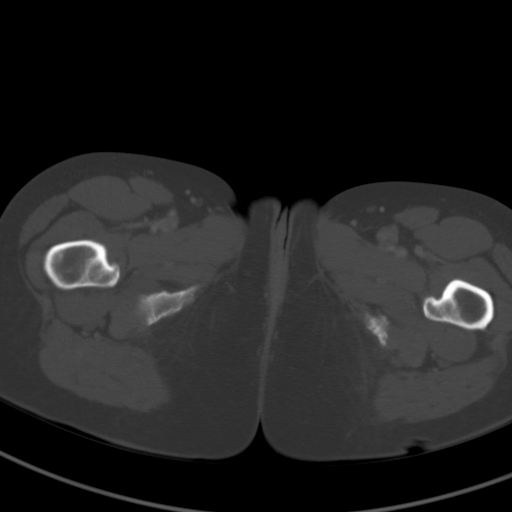
[im 14/85  soft-tissue]
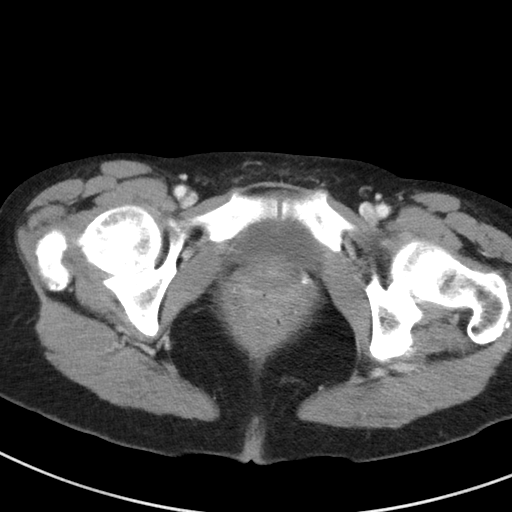
[im 18/85  soft-tissue]
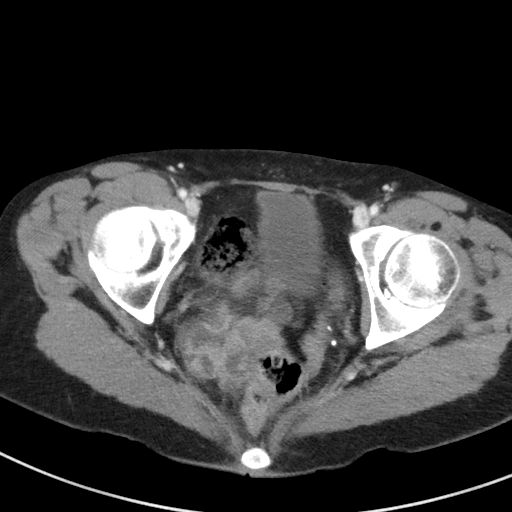
[im 27/85  soft-tissue]
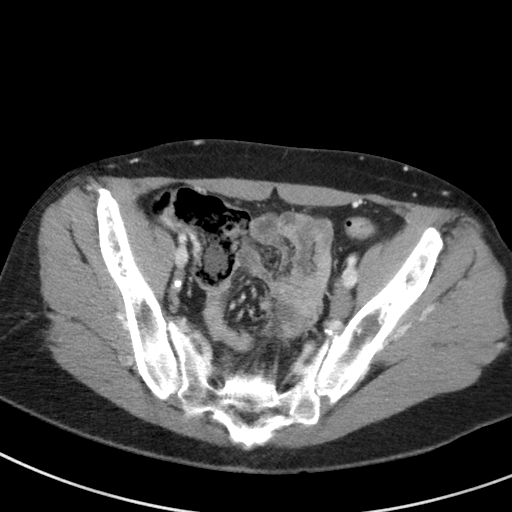
[im 31/85  soft-tissue]
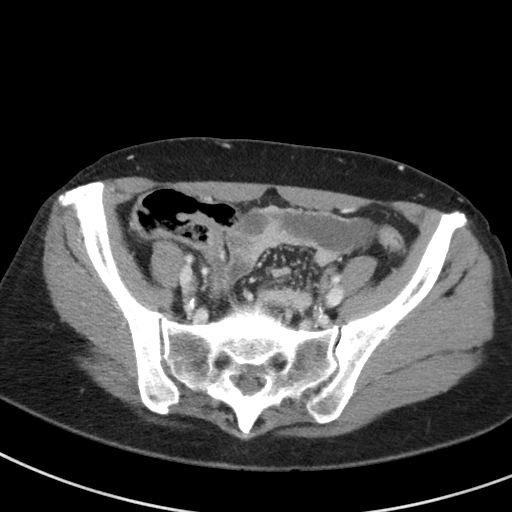
[im 40/85  soft-tissue]
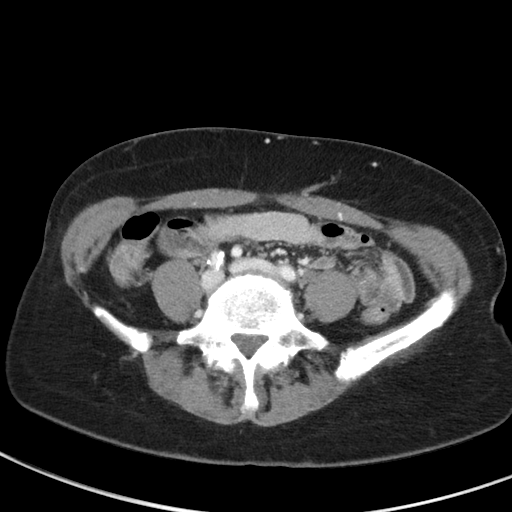
[im 45/85  soft-tissue]
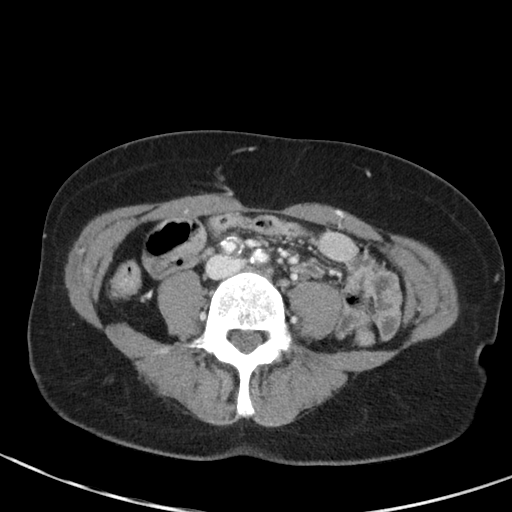
[im 54/85  soft-tissue]
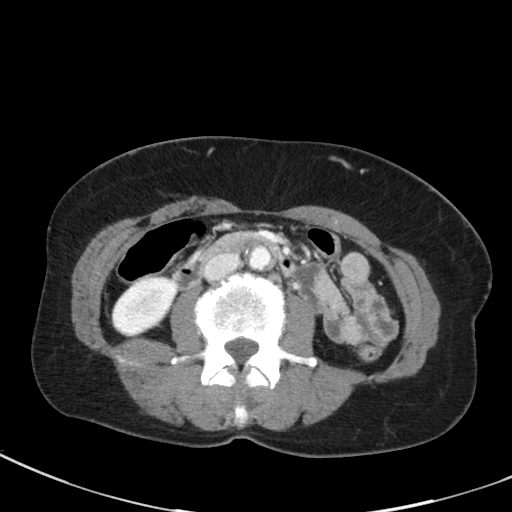
[im 58/85  soft-tissue]
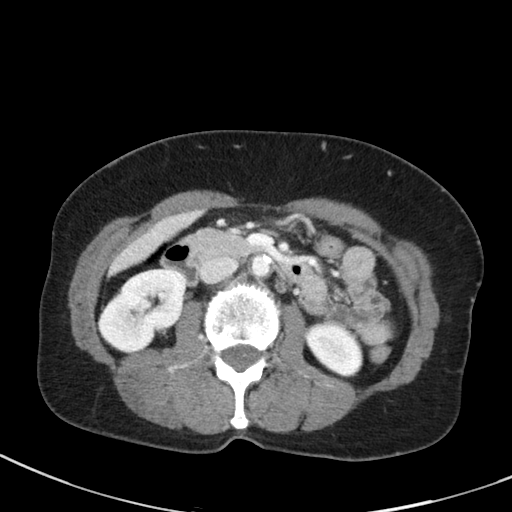
[im 58/85  bone]
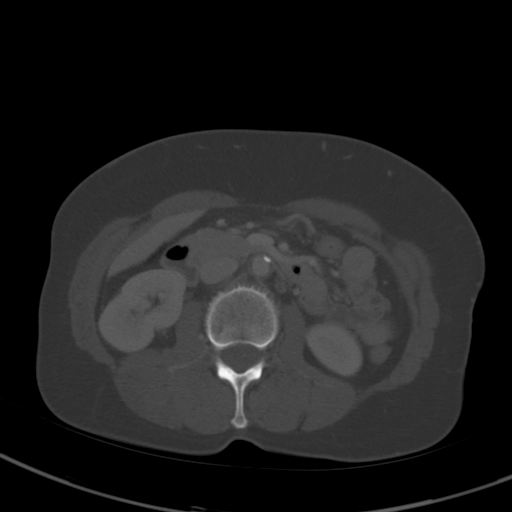
[im 67/85  soft-tissue]
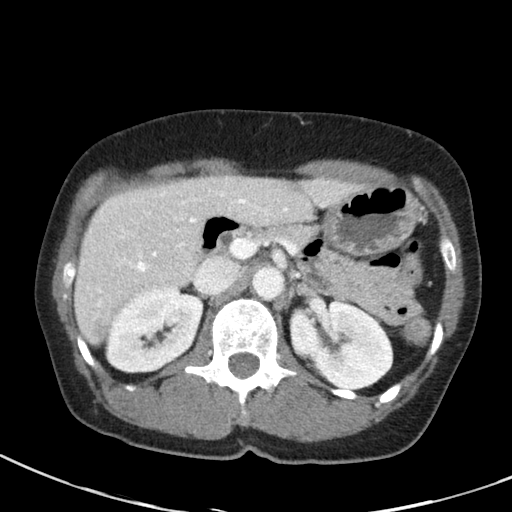
[im 71/85  soft-tissue]
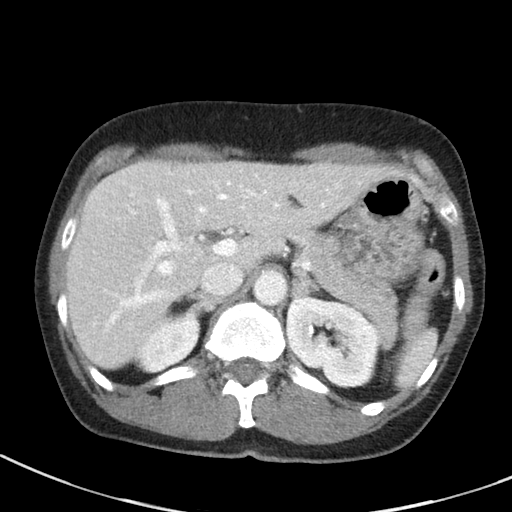
[im 80/85  soft-tissue]
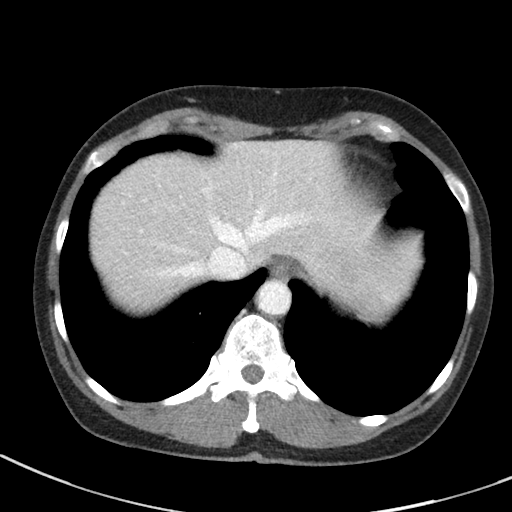

[Series 5: coronal st · coronal · 0.65mm/px · 3 of 68 slices shown]
[im 23/68  soft-tissue]
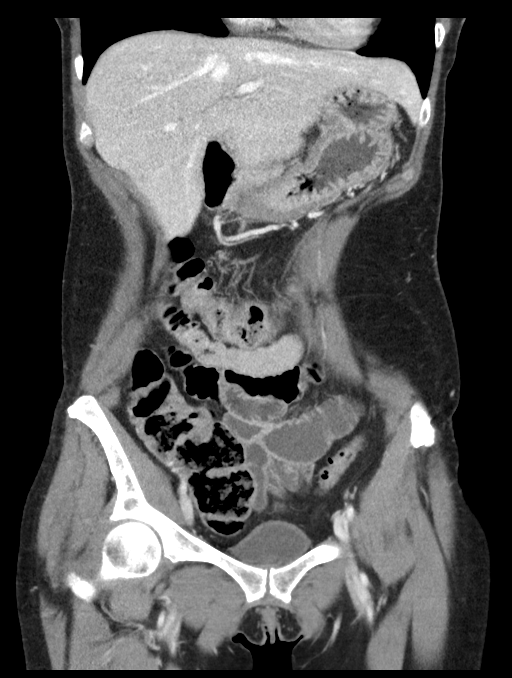
[im 30/68  soft-tissue]
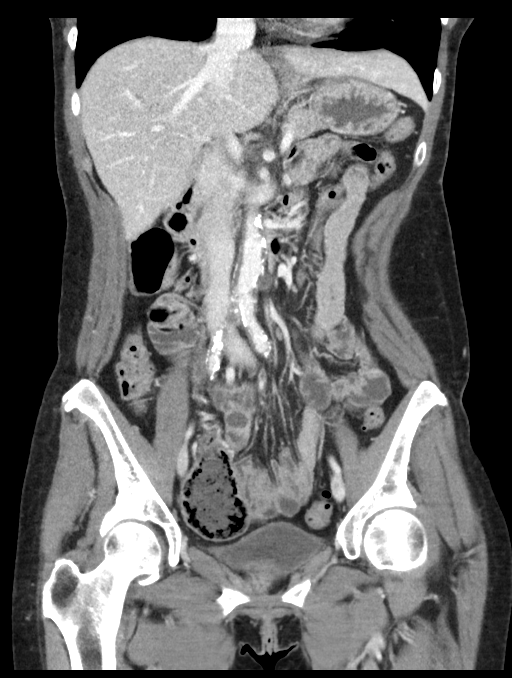
[im 38/68  soft-tissue]
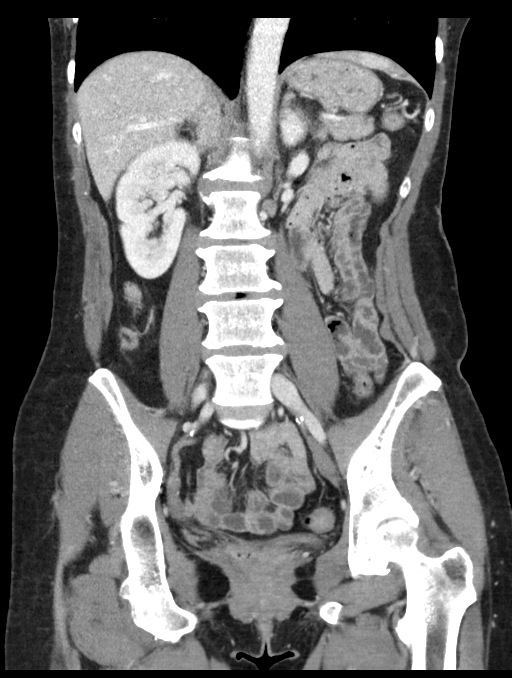

[15 of 46 positions shown; findings below may reference images not displayed]

FINDINGS: Lower chest: The visualized lung bases are grossly clear. The
visualized portions of the mediastinum are unremarkable.

Hepatobiliary: The liver is unremarkable in appearance. The patient
is status post cholecystectomy, with clips noted at the gallbladder
fossa. The common bile duct remains normal in caliber.

Pancreas: The pancreas is within normal limits.

Spleen: The spleen is unremarkable in appearance.

Adrenals/Urinary Tract: The adrenal glands are unremarkable in
appearance. A small left renal cyst is seen. Left renal stones
measure up to 4 mm in size. There is no evidence of hydronephrosis.
No renal or ureteral stones are identified. No perinephric stranding
is seen.

Stomach/Bowel: The stomach is unremarkable in appearance. The small
bowel is within normal limits. The appendix is normal in caliber,
without evidence of appendicitis. The colon is unremarkable in
appearance.

Vascular/Lymphatic: Scattered calcification is seen along the
abdominal aorta and its branches, with mild mural thrombus. There is
likely mild luminal narrowing along the right common iliac artery.
There is question of a mild focal outpouching of the abdominal aorta
at the level of the third segment of the duodenum.

The external iliac arteries appear somewhat diminutive bilaterally.
The inferior vena cava is grossly unremarkable. No retroperitoneal
lymphadenopathy is seen. No pelvic sidewall lymphadenopathy is
identified.

Reproductive: The bladder is mildly distended and grossly
unremarkable. The patient is status post hysterectomy. No suspicious
adnexal masses are seen.

Other: No additional soft tissue abnormalities are seen.

Musculoskeletal: No acute osseous abnormalities are identified.
Vacuum phenomenon is noted at L3-L4. The visualized musculature is
unremarkable in appearance.
IMPRESSION: 1. No acute abnormality seen to explain the patient's symptoms.
2. Scattered aortic atherosclerosis and mild mural thrombus along
the abdominal aorta. Likely mild luminal narrowing along the right
common iliac artery. Question of mild focal outpouching of the
abdominal aorta at the level of the third segment of the duodenum,
without evidence of aneurysmal dilatation.
3. External iliac arteries appear somewhat diminutive bilaterally.
4. Small left renal cyst noted. Left renal stones measure up to 4 mm
in size.

## 2020-01-08 ENCOUNTER — Ambulatory Visit (INDEPENDENT_AMBULATORY_CARE_PROVIDER_SITE_OTHER): Payer: Medicaid Other

## 2020-01-08 ENCOUNTER — Ambulatory Visit (INDEPENDENT_AMBULATORY_CARE_PROVIDER_SITE_OTHER): Payer: Medicaid Other | Admitting: Podiatry

## 2020-01-08 ENCOUNTER — Encounter: Payer: Self-pay | Admitting: *Deleted

## 2020-01-08 ENCOUNTER — Other Ambulatory Visit: Payer: Self-pay

## 2020-01-08 DIAGNOSIS — B070 Plantar wart: Secondary | ICD-10-CM | POA: Diagnosis not present

## 2020-01-08 NOTE — Progress Notes (Signed)
   Subjective: 64 y.o. female presenting today as a new patient for evaluation of right foot pain is been going on for several years.  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  She has had it trimmed in the past by another podiatrist but she presents today for a second opinion.  It is very painful with shoe gear and with ambulation.   Past Medical History:  Diagnosis Date  . High cholesterol   . Hypertension     Objective: Physical Exam General: The patient is alert and oriented x3 in no acute distress.   Dermatology: Hyperkeratotic skin lesion(s) noted to the plantar aspect of the right foot approximately 1 cm in diameter. Pinpoint bleeding noted upon debridement. Skin is warm, dry and supple bilateral lower extremities. Negative for open lesions or macerations.   Vascular: Palpable pedal pulses bilaterally. No edema or erythema noted. Capillary refill within normal limits.   Neurological: Epicritic and protective threshold grossly intact bilaterally.    Musculoskeletal Exam: Pain on palpation to the noted skin lesion(s).  Range of motion within normal limits to all pedal and ankle joints bilateral. Muscle strength 5/5 in all groups bilateral.    Assessment: #1 plantar wart right foot #2 pain in right foot     Plan of Care:  #1 Patient was evaluated. #2 Excisional debridement of the plantar wart lesion(s) was performed using a chisel blade. Cantharone was applied and the lesion(s) was dressed with a dry sterile dressing. #3 patient is to return to clinic in 2 weeks.     Edrick Kins, DPM [facility]  Dr. Edrick Kins, [address]                                        [place], [place] [address]                Office [phone]  Fax [phone]

## 2020-01-14 ENCOUNTER — Ambulatory Visit: Payer: Medicaid Other | Admitting: Podiatry

## 2020-01-22 ENCOUNTER — Ambulatory Visit (INDEPENDENT_AMBULATORY_CARE_PROVIDER_SITE_OTHER): Payer: Medicaid Other | Admitting: Podiatry

## 2020-01-22 ENCOUNTER — Other Ambulatory Visit: Payer: Self-pay

## 2020-01-22 DIAGNOSIS — B070 Plantar wart: Secondary | ICD-10-CM

## 2020-01-22 NOTE — Progress Notes (Signed)
   Subjective: Patient presents today for follow-up evaluation of a plantar wart to the right lower extremity. Patient presents today for follow-up treatment and evaluation   Past Medical History:  Diagnosis Date  . High cholesterol   . Hypertension      Objective: Physical Exam General: The patient is alert and oriented x3 in no acute distress.   Dermatology: Hyperkeratotic skin lesion noted to the plantar aspect of the right foot approximately 1 cm in diameter. Pinpoint bleeding noted upon debridement. Skin is warm, dry and supple bilateral lower extremities. Negative for open lesions or macerations.   Vascular: Palpable pedal pulses bilaterally. No edema or erythema noted. Capillary refill within normal limits.   Neurological: Epicritic and protective threshold grossly intact bilaterally.    Musculoskeletal Exam: Pain on palpation to the noted skin lesion.  Range of motion within normal limits to all pedal and ankle joints bilateral. Muscle strength 5/5 in all groups bilateral.    Assessment: #1 plantar wart right foot #2 pain in right foot     Plan of Care:  #1 Patient was evaluated. #2 Excisional debridement of the plantar wart lesion was performed using a chisel blade. Salicylic acid was applied and the lesion was dressed with a dry sterile dressing. #3  Recommend OTC wart remover as needed #4  Return to clinic as needed     Edrick Kins, DPM [facility]  Dr. Edrick Kins, [address]                                        [place], [place] [address]                Office [phone]  Fax [phone]

## 2020-08-08 ENCOUNTER — Encounter: Payer: Self-pay | Admitting: Podiatry

## 2020-08-08 ENCOUNTER — Ambulatory Visit (INDEPENDENT_AMBULATORY_CARE_PROVIDER_SITE_OTHER): Payer: Medicaid Other | Admitting: Podiatry

## 2020-08-08 ENCOUNTER — Other Ambulatory Visit: Payer: Self-pay

## 2020-08-08 DIAGNOSIS — F17200 Nicotine dependence, unspecified, uncomplicated: Secondary | ICD-10-CM | POA: Insufficient documentation

## 2020-08-08 DIAGNOSIS — D492 Neoplasm of unspecified behavior of bone, soft tissue, and skin: Secondary | ICD-10-CM

## 2020-08-08 DIAGNOSIS — K5730 Diverticulosis of large intestine without perforation or abscess without bleeding: Secondary | ICD-10-CM | POA: Insufficient documentation

## 2020-08-08 DIAGNOSIS — M25579 Pain in unspecified ankle and joints of unspecified foot: Secondary | ICD-10-CM | POA: Insufficient documentation

## 2020-08-08 DIAGNOSIS — B070 Plantar wart: Secondary | ICD-10-CM

## 2020-08-08 DIAGNOSIS — D126 Benign neoplasm of colon, unspecified: Secondary | ICD-10-CM | POA: Insufficient documentation

## 2020-08-08 DIAGNOSIS — H524 Presbyopia: Secondary | ICD-10-CM | POA: Insufficient documentation

## 2020-08-11 ENCOUNTER — Encounter: Payer: Self-pay | Admitting: Podiatry

## 2020-08-11 ENCOUNTER — Telehealth: Payer: Self-pay | Admitting: Podiatry

## 2020-08-11 ENCOUNTER — Telehealth: Payer: Self-pay

## 2020-08-11 NOTE — Telephone Encounter (Signed)
Pt stated that she is unable to put a shoe on due to the procedure performed on Friday. She would like a note for work from yesterday and today to return tomorrow. Please advise.

## 2020-08-11 NOTE — Telephone Encounter (Signed)
Another encounter has been opened. Letter generated and pt has picked it up.

## 2020-08-11 NOTE — Telephone Encounter (Signed)
Pt called again about needing a note for work today as her shift starts at 2pm and she did not want to wait to late to get it.   I did ask Dr Amalia Hailey and he said it was ok to write the note and pt wants to pick it up in [place].

## 2020-08-11 NOTE — Telephone Encounter (Signed)
Letter generated and picked up by pt.

## 2020-08-26 NOTE — Progress Notes (Signed)
   Subjective: Patient presents today for recurrence of a plantar wart to the right foot.  Past Medical History:  Diagnosis Date  . High cholesterol   . Hypertension      Objective: Physical Exam General: The patient is alert and oriented x3 in no acute distress.   Dermatology: Hyperkeratotic skin lesion noted to the plantar aspect of the right foot approximately 1 cm in diameter. Pinpoint bleeding noted upon debridement. Skin is warm, dry and supple bilateral lower extremities. Negative for open lesions or macerations.   Vascular: Palpable pedal pulses bilaterally. No edema or erythema noted. Capillary refill within normal limits.   Neurological: Epicritic and protective threshold grossly intact bilaterally.    Musculoskeletal Exam: Pain on palpation to the noted skin lesion.  Range of motion within normal limits to all pedal and ankle joints bilateral. Muscle strength 5/5 in all groups bilateral.    Assessment: #1 plantar wart right foot #2 pain in right foot     Plan of Care:  #1 Patient was evaluated. #2 Excisional debridement of the plantar wart lesion was performed using a chisel blade. Salicylic acid was applied and the lesion was dressed with a dry sterile dressing. #3  Recommend OTC wart remover as needed #4  Return to clinic as needed     Edrick Kins, DPM [facility]  Dr. Edrick Kins, DPM    [address], [place] [address]                Office [phone]  Fax [phone]

## 2021-05-14 ENCOUNTER — Emergency Department (HOSPITAL_COMMUNITY)
Admission: EM | Admit: 2021-05-14 | Discharge: 2021-05-14 | Disposition: A | Discharge status: Home / Self Care | Payer: No Typology Code available for payment source | Attending: Emergency Medicine | Admitting: Emergency Medicine

## 2021-05-14 ENCOUNTER — Encounter (HOSPITAL_COMMUNITY): Payer: Self-pay | Admitting: Emergency Medicine

## 2021-05-14 ENCOUNTER — Emergency Department (HOSPITAL_COMMUNITY): Payer: No Typology Code available for payment source

## 2021-05-14 DIAGNOSIS — R319 Hematuria, unspecified: Secondary | ICD-10-CM | POA: Diagnosis not present

## 2021-05-14 DIAGNOSIS — I10 Essential (primary) hypertension: Secondary | ICD-10-CM | POA: Insufficient documentation

## 2021-05-14 DIAGNOSIS — R110 Nausea: Secondary | ICD-10-CM | POA: Diagnosis present

## 2021-05-14 LAB — URINALYSIS, MICROSCOPIC (REFLEX): RBC / HPF: >50 RBC/hpf (ref 0–5)

## 2021-05-14 LAB — CBC
HCT: 38.9 % (ref 36.0–46.0)
Hemoglobin: 12.2 g/dL (ref 12.0–15.0)
MCH: 23.6 pg — ABNORMAL LOW (ref 26.0–34.0)
MCHC: 31.4 g/dL (ref 30.0–36.0)
MCV: 75.1 fL — ABNORMAL LOW (ref 80.0–100.0)
Platelets: 423 10*3/uL — ABNORMAL HIGH (ref 150–400)
RBC: 5.18 MIL/uL — ABNORMAL HIGH (ref 3.87–5.11)
RDW: 14 % (ref 11.5–15.5)
WBC: 14.9 10*3/uL — ABNORMAL HIGH (ref 4.0–10.5)
nRBC: 0 % (ref 0.0–0.2)

## 2021-05-14 LAB — COMPREHENSIVE METABOLIC PANEL
ALT: 23 U/L (ref 0–44)
AST: 31 U/L (ref 15–41)
Albumin: 4.3 g/dL (ref 3.5–5.0)
Alkaline Phosphatase: 83 U/L (ref 38–126)
Anion gap: 13 (ref 5–15)
BUN: 14 mg/dL (ref 8–23)
CO2: 23 mmol/L (ref 22–32)
Calcium: 9.9 mg/dL (ref 8.9–10.3)
Chloride: 99 mmol/L (ref 98–111)
Creatinine, Ser: 1.03 mg/dL — ABNORMAL HIGH (ref 0.44–1.00)
GFR, Estimated: >60 mL/min (ref >60)
Glucose, Bld: 168 mg/dL — ABNORMAL HIGH (ref 70–99)
Potassium: 3.3 mmol/L — ABNORMAL LOW (ref 3.5–5.1)
Sodium: 135 mmol/L (ref 135–145)
Total Bilirubin: 0.8 mg/dL (ref 0.3–1.2)
Total Protein: 8.3 g/dL — ABNORMAL HIGH (ref 6.5–8.1)

## 2021-05-14 LAB — URINALYSIS, ROUTINE W REFLEX MICROSCOPIC
Bilirubin Urine: NEGATIVE
Glucose, UA: NEGATIVE mg/dL
Ketones, ur: NEGATIVE mg/dL
Nitrite: POSITIVE — AB
Protein, ur: 100 mg/dL — AB
Specific Gravity, Urine: 1.02 (ref 1.005–1.030)
pH: 5 (ref 5.0–8.0)

## 2021-05-14 LAB — LIPASE, BLOOD: Lipase: 28 U/L (ref 11–51)

## 2021-05-14 MED ORDER — CEPHALEXIN 500 MG PO CAPS: 500.0000 mg | ORAL_CAPSULE | Freq: Four times a day (QID) | ORAL | 0 refills | Status: DC

## 2021-05-14 MED ORDER — ONDANSETRON 4 MG PO TBDP
4.0000 mg | ORAL_TABLET | Freq: Once | ORAL | Status: AC
  Administered 2021-05-14: 4 mg via ORAL
  Filled 2021-05-14: qty 1

## 2021-05-14 MED ORDER — SODIUM CHLORIDE 0.9 % IV BOLUS
500.0000 mL | Freq: Once | INTRAVENOUS | Status: AC
  Administered 2021-05-14: 500 mL via INTRAVENOUS

## 2021-05-14 MED ORDER — PROMETHAZINE HCL 25 MG PO TABS: 25.0000 mg | ORAL_TABLET | Freq: Four times a day (QID) | ORAL | 0 refills | Status: DC | PRN

## 2021-05-14 MED ORDER — SODIUM CHLORIDE 0.9 % IV SOLN
12.5000 mg | Freq: Once | INTRAVENOUS | Status: AC
  Administered 2021-05-14: 12.5 mg via INTRAVENOUS
  Filled 2021-05-14: qty 0.5

## 2021-05-14 MED ORDER — KETOROLAC TROMETHAMINE 30 MG/ML IJ SOLN
30.0000 mg | Freq: Once | INTRAMUSCULAR | Status: AC
  Administered 2021-05-14: 30 mg via INTRAVENOUS
  Filled 2021-05-14: qty 1

## 2021-05-14 MED ORDER — HALOPERIDOL LACTATE 5 MG/ML IJ SOLN
2.0000 mg | Freq: Once | INTRAMUSCULAR | Status: AC
  Administered 2021-05-14: 2 mg via INTRAVENOUS
  Filled 2021-05-14: qty 1

## 2021-05-14 NOTE — ED Triage Notes (Signed)
Pt. Stated, Kayla Haynes been nausea since Tuesday. They usually give me phenergan IV and Im on my way. Pt goes tot he [facility] for primary care.

## 2021-05-14 NOTE — ED Notes (Signed)
Discharge instructions reviewed and explained, pt verbalized understanding.

## 2021-05-14 NOTE — ED Provider Notes (Signed)
[facility] EMERGENCY DEPARTMENT Provider Note   CSN: [id number] Arrival date & time: 05/14/21  5170     History  Chief Complaint  Patient presents with   Nausea   Abdominal Pain    Kayla Haynes is a 66 y.o. female.  She has a history of hypertension.  Complaining of nausea that started yesterday.  Denies any vomiting no abdominal pain.  She said she feels like her food is just sitting in her stomach.  No fevers chills.  Has a history of nausea which she blames on agent orange from [facility].  She said she usually is responsive to Phenergan.  She tried over-the-counter Dramamine partial improvement.  She does smoke marijuana daily.  She said the shower with hot water beating on her stomach helped.  The history is provided by the patient.  Emesis Severity:  Moderate Duration:  2 days Timing:  Constant Number of daily episodes:  0 Progression:  Unchanged Chronicity:  Recurrent Recent urination:  Normal Relieved by:  Nothing Worsened by:  Nothing Ineffective treatments:  Antiemetics Associated symptoms: no abdominal pain, no cough, no diarrhea, no fever, no headaches and no sore throat   Risk factors: no sick contacts       Home Medications Prior to Admission medications   Not on File      Allergies    Patient has no known allergies.    Review of Systems   Review of Systems  Constitutional:  Negative for fever.  HENT:  Negative for sore throat.   Eyes:  Negative for visual disturbance.  Respiratory:  Negative for cough and shortness of breath.   Cardiovascular:  Negative for chest pain.  Gastrointestinal:  Positive for nausea. Negative for abdominal pain, diarrhea and vomiting.  Genitourinary:  Negative for dysuria.  Musculoskeletal:  Negative for back pain.  Skin:  Negative for rash.  Neurological:  Negative for headaches.   Physical Exam Updated Vital Signs BP (!) 137/112 (BP Location: Right Arm)    Pulse 73    Temp 98.5 F (36.9 C)  (Oral)    Resp 20    SpO2 100%  Physical Exam Vitals and nursing note reviewed.  Constitutional:      General: She is not in acute distress.    Appearance: Normal appearance. She is well-developed.  HENT:     Head: Normocephalic and atraumatic.  Eyes:     Conjunctiva/sclera: Conjunctivae normal.  Cardiovascular:     Rate and Rhythm: Normal rate and regular rhythm.     Heart sounds: No murmur heard. Pulmonary:     Effort: Pulmonary effort is normal. No respiratory distress.     Breath sounds: Normal breath sounds.  Abdominal:     Palpations: Abdomen is soft.     Tenderness: There is no abdominal tenderness. There is no guarding or rebound.  Musculoskeletal:        General: No swelling or deformity. Normal range of motion.     Cervical back: Neck supple.  Skin:    General: Skin is warm and dry.     Capillary Refill: Capillary refill takes less than 2 seconds.  Neurological:     General: No focal deficit present.     Mental Status: She is alert.     Gait: Gait normal.  Psychiatric:        Mood and Affect: Mood normal.    ED Results / Procedures / Treatments   Labs (all labs ordered are listed, but only abnormal results  are displayed) Labs Reviewed  COMPREHENSIVE METABOLIC PANEL - Abnormal; Notable for the following components:      Result Value   Potassium 3.3 (*)    Glucose, Bld 168 (*)    Creatinine, Ser 1.03 (*)    Total Protein 8.3 (*)    All other components within normal limits  CBC - Abnormal; Notable for the following components:   WBC 14.9 (*)    RBC 5.18 (*)    MCV 75.1 (*)    MCH 23.6 (*)    Platelets 423 (*)    All other components within normal limits  URINALYSIS, ROUTINE W REFLEX MICROSCOPIC - Abnormal; Notable for the following components:   Color, Urine AMBER (*)    APPearance CLOUDY (*)    Hgb urine dipstick LARGE (*)    Protein, ur 100 (*)    Nitrite POSITIVE (*)    Leukocytes,Ua TRACE (*)    All other components within normal limits   URINALYSIS, MICROSCOPIC (REFLEX) - Abnormal; Notable for the following components:   Bacteria, UA FEW (*)    All other components within normal limits  URINE CULTURE  LIPASE, BLOOD    EKG None  Radiology CT Renal Stone Study  Result Date: 05/14/2021 CLINICAL DATA:  Acute flank pain. EXAM: CT ABDOMEN AND PELVIS WITHOUT CONTRAST TECHNIQUE: Multidetector CT imaging of the abdomen and pelvis was performed following the standard protocol without IV contrast. RADIATION DOSE REDUCTION: This exam was performed according to the departmental dose-optimization program which includes automated exposure control, adjustment of the mA and/or kV according to patient size and/or use of iterative reconstruction technique. COMPARISON:  Sep 04, 2017. FINDINGS: Lower chest: No acute abnormality. Hepatobiliary: Status post cholecystectomy. Stable right hepatic cyst. No biliary dilatation is noted. Pancreas: Unremarkable. No pancreatic ductal dilatation or surrounding inflammatory changes. Spleen: Normal in size without focal abnormality. Adrenals/Urinary Tract: Adrenal glands appear normal. Right renal cyst is noted. Left nephrolithiasis is noted. This includes 7 mm calculus in left renal pelvis. No hydronephrosis or renal obstruction is noted. Urinary bladder is unremarkable. Stomach/Bowel: Stomach is within normal limits. Appendix appears normal. No evidence of bowel wall thickening, distention, or inflammatory changes. Vascular/Lymphatic: Aortic atherosclerosis. No enlarged abdominal or pelvic lymph nodes. Reproductive: Status post hysterectomy. No adnexal masses. Other: No abdominal wall hernia or abnormality. No abdominopelvic ascites. Musculoskeletal: No acute or significant osseous findings. IMPRESSION: Nonobstructive left nephrolithiasis. No hydronephrosis or renal obstruction is noted. Aortic Atherosclerosis (ICD10-I70.0). Electronically Signed   By: Marijo Conception M.D.   On: 05/14/2021 12:52     Procedures Procedures    Medications Ordered in ED Medications  ondansetron (ZOFRAN-ODT) disintegrating tablet 4 mg (4 mg Oral Given 05/14/21 0854)  promethazine (PHENERGAN) 12.5 mg in sodium chloride 0.9 % 50 mL IVPB (0 mg Intravenous Stopped 05/14/21 1042)  sodium chloride 0.9 % bolus 500 mL (0 mLs Intravenous Stopped 05/14/21 1042)  haloperidol lactate (HALDOL) injection 2 mg (2 mg Intravenous Given 05/14/21 1153)  ketorolac (TORADOL) 30 MG/ML injection 30 mg (30 mg Intravenous Given 05/14/21 1443)    ED Course/ Medical Decision Making/ A&P Clinical Course as of 05/14/21 1624  Thu May 14, 2021  1426 Patient states she is feeling better and wants to go home.  CT renal did not show any obvious stone or mass I will cover her for infection with some antibiotics. [MB]    Clinical Course User Index [MB] Hayden Rasmussen, MD  Medical Decision Making Amount and/or Complexity of Data Reviewed Labs: ordered. Radiology: ordered.  Risk Prescription drug management.  This patient complains of nausea; this involves an extensive number of treatment Options and is a complaint that carries with it a high risk of complications and Morbidity. The differential includes nausea, gastritis, renal colic, UTI cannabis hyperemesis, obstruction  I ordered, reviewed and interpreted labs, which included CBC with elevated white count and stable hemoglobin, chemistries with mildly low potassium and elevated glucose, urinalysis with greater than 50 red cells 6-10 whites nitrite positive. I ordered medication fluids nausea medication and Toradol with some improvement in her nausea I ordered imaging studies which included CT renal and I independently    visualized and interpreted imaging which showed no evidence of kidney stones or masses Previous records obtained and reviewed in epic, no recent admissions  After the interventions stated above, I reevaluated the patient and found patient  to be symptomatically improved.  She is eaten and drank a little in the department.  She would like to be discharged.  I think this is reasonable at this time.  Recommended close follow-up with her treating providers at the [place].  Return instructions discussed          Final Clinical Impression(s) / ED Diagnoses Final diagnoses:  Nausea  Hematuria, unspecified type    Rx / DC Orders ED Discharge Orders          Ordered    promethazine (PHENERGAN) 25 MG tablet  Every 6 hours PRN        05/14/21 1427    cephALEXin (KEFLEX) 500 MG capsule  4 times daily        05/14/21 1427              Hayden Rasmussen, MD 05/14/21 1626

## 2021-05-14 NOTE — ED Notes (Signed)
Pt calling out wanting something to sleep

## 2021-05-14 NOTE — Discharge Instructions (Addendum)
You were seen in the emergency department for nausea.  Your lab work did not show an obvious explanation for your symptoms.  You did have some blood in your urine and we are putting you on an antibiotic.  Also given you prescription for some nausea medication.  Please schedule a follow-up appointment with your primary care doctor.  Return if any worsening or concerning symptoms

## 2021-05-15 LAB — URINE CULTURE

## 2022-09-07 ENCOUNTER — Emergency Department: Payer: No Typology Code available for payment source

## 2022-09-07 ENCOUNTER — Emergency Department
Admission: EM | Admit: 2022-09-07 | Discharge: 2022-09-07 | Disposition: A | Discharge status: Home / Self Care | Payer: No Typology Code available for payment source | Attending: Emergency Medicine | Admitting: Emergency Medicine

## 2022-09-07 ENCOUNTER — Encounter: Payer: Self-pay | Admitting: Radiology

## 2022-09-07 ENCOUNTER — Other Ambulatory Visit: Payer: Self-pay

## 2022-09-07 DIAGNOSIS — I10 Essential (primary) hypertension: Secondary | ICD-10-CM | POA: Insufficient documentation

## 2022-09-07 DIAGNOSIS — D72829 Elevated white blood cell count, unspecified: Secondary | ICD-10-CM | POA: Diagnosis not present

## 2022-09-07 DIAGNOSIS — R001 Bradycardia, unspecified: Secondary | ICD-10-CM | POA: Insufficient documentation

## 2022-09-07 DIAGNOSIS — R197 Diarrhea, unspecified: Secondary | ICD-10-CM | POA: Insufficient documentation

## 2022-09-07 DIAGNOSIS — Z79899 Other long term (current) drug therapy: Secondary | ICD-10-CM | POA: Insufficient documentation

## 2022-09-07 DIAGNOSIS — N202 Calculus of kidney with calculus of ureter: Secondary | ICD-10-CM | POA: Insufficient documentation

## 2022-09-07 DIAGNOSIS — R8271 Bacteriuria: Secondary | ICD-10-CM

## 2022-09-07 DIAGNOSIS — R1030 Lower abdominal pain, unspecified: Secondary | ICD-10-CM

## 2022-09-07 DIAGNOSIS — R112 Nausea with vomiting, unspecified: Secondary | ICD-10-CM

## 2022-09-07 DIAGNOSIS — R1031 Right lower quadrant pain: Secondary | ICD-10-CM | POA: Diagnosis present

## 2022-09-07 DIAGNOSIS — N200 Calculus of kidney: Secondary | ICD-10-CM

## 2022-09-07 LAB — HEPATIC FUNCTION PANEL
ALT: 16 U/L (ref 0–44)
AST: 29 U/L (ref 15–41)
Albumin: 4.8 g/dL (ref 3.5–5.0)
Alkaline Phosphatase: 111 U/L (ref 38–126)
Bilirubin, Direct: 0.1 mg/dL (ref 0.0–0.2)
Indirect Bilirubin: 0.9 mg/dL (ref 0.3–0.9)
Total Bilirubin: 1 mg/dL (ref 0.3–1.2)
Total Protein: 9.3 g/dL — ABNORMAL HIGH (ref 6.5–8.1)

## 2022-09-07 LAB — BASIC METABOLIC PANEL
Anion gap: 14 (ref 5–15)
BUN: 18 mg/dL (ref 8–23)
CO2: 18 mmol/L — ABNORMAL LOW (ref 22–32)
Calcium: 10.2 mg/dL (ref 8.9–10.3)
Chloride: 103 mmol/L (ref 98–111)
Creatinine, Ser: 1.08 mg/dL — ABNORMAL HIGH (ref 0.44–1.00)
GFR, Estimated: 57 mL/min — ABNORMAL LOW (ref >60)
Glucose, Bld: 268 mg/dL — ABNORMAL HIGH (ref 70–99)
Potassium: 3.4 mmol/L — ABNORMAL LOW (ref 3.5–5.1)
Sodium: 135 mmol/L (ref 135–145)

## 2022-09-07 LAB — CBC
HCT: 40.8 % (ref 36.0–46.0)
Hemoglobin: 12.5 g/dL (ref 12.0–15.0)
MCH: 22.9 pg — ABNORMAL LOW (ref 26.0–34.0)
MCHC: 30.6 g/dL (ref 30.0–36.0)
MCV: 74.9 fL — ABNORMAL LOW (ref 80.0–100.0)
Platelets: 453 10*3/uL — ABNORMAL HIGH (ref 150–400)
RBC: 5.45 MIL/uL — ABNORMAL HIGH (ref 3.87–5.11)
RDW: 14.3 % (ref 11.5–15.5)
WBC: 16 10*3/uL — ABNORMAL HIGH (ref 4.0–10.5)
nRBC: 0 % (ref 0.0–0.2)

## 2022-09-07 LAB — URINALYSIS, ROUTINE W REFLEX MICROSCOPIC
Bacteria, UA: NONE SEEN
Bilirubin Urine: NEGATIVE
Glucose, UA: 150 mg/dL — AB
Ketones, ur: 5 mg/dL — AB
Nitrite: NEGATIVE
Protein, ur: NEGATIVE mg/dL
RBC / HPF: >50 RBC/hpf (ref 0–5)
Specific Gravity, Urine: 1.038 — ABNORMAL HIGH (ref 1.005–1.030)
pH: 6 (ref 5.0–8.0)

## 2022-09-07 LAB — TROPONIN I (HIGH SENSITIVITY): Troponin I (High Sensitivity): 7 ng/L (ref <18)

## 2022-09-07 LAB — LIPASE, BLOOD: Lipase: 30 U/L (ref 11–51)

## 2022-09-07 MED ORDER — ONDANSETRON 4 MG PO TBDP: 4.0000 mg | ORAL_TABLET | Freq: Four times a day (QID) | ORAL | 0 refills | Status: DC | PRN

## 2022-09-07 MED ORDER — ONDANSETRON 4 MG PO TBDP
4.0000 mg | ORAL_TABLET | Freq: Once | ORAL | Status: AC
  Administered 2022-09-07: 4 mg via ORAL
  Filled 2022-09-07: qty 1

## 2022-09-07 MED ORDER — IOHEXOL 300 MG/ML  SOLN
100.0000 mL | Freq: Once | INTRAMUSCULAR | Status: AC | PRN
  Administered 2022-09-07: 100 mL via INTRAVENOUS

## 2022-09-07 MED ORDER — ONDANSETRON 4 MG PO TBDP
4.0000 mg | ORAL_TABLET | Freq: Once | ORAL | Status: AC
Start: 2022-09-07 — End: 2022-09-07
  Administered 2022-09-07: 4 mg via ORAL
  Filled 2022-09-07: qty 1

## 2022-09-07 MED ORDER — ONDANSETRON HCL 4 MG/2ML IJ SOLN: 4.0000 mg | Freq: Once | INTRAMUSCULAR | Status: DC

## 2022-09-07 MED ORDER — ONDANSETRON HCL 4 MG/2ML IJ SOLN
4.0000 mg | Freq: Once | INTRAMUSCULAR | Status: AC
Start: 2022-09-07 — End: 2022-09-07
  Administered 2022-09-07: 4 mg via INTRAVENOUS
  Filled 2022-09-07: qty 2

## 2022-09-07 MED ORDER — CEPHALEXIN 500 MG PO CAPS
500.0000 mg | ORAL_CAPSULE | Freq: Two times a day (BID) | ORAL | 0 refills | Status: DC
Start: 2022-09-07 — End: 2023-04-08

## 2022-09-07 MED ORDER — CEPHALEXIN 500 MG PO CAPS
500.0000 mg | ORAL_CAPSULE | Freq: Once | ORAL | Status: AC
  Administered 2022-09-07: 500 mg via ORAL
  Filled 2022-09-07: qty 1

## 2022-09-07 MED ORDER — SODIUM CHLORIDE 0.9 % IV BOLUS
1000.0000 mL | Freq: Once | INTRAVENOUS | Status: AC
  Administered 2022-09-07: 1000 mL via INTRAVENOUS

## 2022-09-07 MED ORDER — DROPERIDOL 2.5 MG/ML IJ SOLN
2.5000 mg | Freq: Once | INTRAMUSCULAR | Status: AC
Start: 2022-09-07 — End: 2022-09-07
  Administered 2022-09-07: 2.5 mg via INTRAVENOUS
  Filled 2022-09-07: qty 2

## 2022-09-07 NOTE — ED Triage Notes (Signed)
Pt to ED for nausea after being started on new bp meds last week. Pt with DOE, however denies shob. Pt appears diaphoretic and clammy. Denies pain. States just "doesn't feel right"

## 2022-09-07 NOTE — ED Notes (Signed)
Multiple attempts made at IV with no success

## 2022-09-07 NOTE — ED Provider Notes (Signed)
[facility] Provider Note    Event Date/Time   First MD Initiated Contact with Patient 09/07/22 1152     (approximate)   History   Chief Complaint Nausea   HPI  Rylynn Minniear is a 67 y.o. female with past medical history of hypertension, hyperlipidemia, and PAD who presents to the ED complaining of nausea.  Patient reports that she has been feeling nauseous with multiple episodes of vomiting since they changed her blood pressure medications at the [place] last week.  She reports crampy pain in the bilateral lower quadrants of her abdomen as well as some diarrhea, denies any fevers, dysuria, hematuria, or flank pain.  She also denies any pain in her chest or difficulty breathing.  She has not been taking anything for her symptoms at home.     Physical Exam   Triage Vital Signs: ED Triage Vitals  Enc Vitals Group     BP 09/07/22 1132 (!) 165/114     Pulse Rate 09/07/22 1132 78     Resp 09/07/22 1132 (!) 22     Temp 09/07/22 1132 (!) 97.5 F (36.4 C)     Temp src --      SpO2 09/07/22 1132 98 %     Weight 09/07/22 1127 135 lb (61.2 kg)     Height 09/07/22 1127 5\' 3"  (1.6 m)     Head Circumference --      Peak Flow --      Pain Score 09/07/22 1127 0     Pain Loc --      Pain Edu? --      Excl. in GC? --     Most recent vital signs: Vitals:   09/07/22 1132 09/07/22 1211  BP: (!) 165/114 95/79  Pulse: 78 (!) 54  Resp: (!) 22 19  Temp: (!) 97.5 F (36.4 C)   SpO2: 98% 100%    Constitutional: Alert and oriented. Eyes: Conjunctivae are normal. Head: Atraumatic. Nose: No congestion/rhinnorhea. Mouth/Throat: Mucous membranes are moist.  Cardiovascular: Bradycardic, regular rhythm. Grossly normal heart sounds.  2+ radial pulses bilaterally. Respiratory: Normal respiratory effort.  No retractions. Lungs CTAB. Gastrointestinal: Soft and tender to palpation in the bilateral lower quadrants with no rebound or guarding.  No CVA tenderness bilaterally.  No distention. Musculoskeletal: No lower extremity tenderness nor edema.  Neurologic:  Normal speech and language. No gross focal neurologic deficits are appreciated.    ED Results / Procedures / Treatments   Labs (all labs ordered are listed, but only abnormal results are displayed) Labs Reviewed  CBC - Abnormal; Notable for the following components:      Result Value   WBC 16.0 (*)    RBC 5.45 (*)    MCV 74.9 (*)    MCH 22.9 (*)    Platelets 453 (*)    All other components within normal limits  BASIC METABOLIC PANEL - Abnormal; Notable for the following components:   Potassium 3.4 (*)    CO2 18 (*)    Glucose, Bld 268 (*)    Creatinine, Ser 1.08 (*)    GFR, Estimated 57 (*)    All other components within normal limits  HEPATIC FUNCTION PANEL - Abnormal; Notable for the following components:   Total Protein 9.3 (*)    All other components within normal limits  URINALYSIS, ROUTINE W REFLEX MICROSCOPIC - Abnormal; Notable for the following components:   Color, Urine STRAW (*)    APPearance CLEAR (*)  Specific Gravity, Urine 1.038 (*)    Glucose, UA 150 (*)    Hgb urine dipstick MODERATE (*)    Ketones, ur 5 (*)    Leukocytes,Ua TRACE (*)    All other components within normal limits  LIPASE, BLOOD  TROPONIN I (HIGH SENSITIVITY)     EKG  ED ECG REPORT I, Chesley Noon, the attending physician, personally viewed and interpreted this ECG.   Date: 09/07/2022  EKG Time: 11:29  Rate: 73  Rhythm: normal sinus rhythm  Axis: Normal  Intervals:none  ST&T Change: None  RADIOLOGY CT abdomen/pelvis reviewed and interpreted by me with proximal left ureteral stone, no inflammatory changes or dilated bowel loops noted.  PROCEDURES:  Critical Care performed: No  Procedures   MEDICATIONS ORDERED IN ED: Medications  ondansetron (ZOFRAN-ODT) disintegrating tablet 4 mg (4 mg Oral Given 09/07/22 1133)  sodium chloride 0.9 % bolus 1,000 mL (0 mLs Intravenous Stopped  09/07/22 1513)  ondansetron (ZOFRAN) injection 4 mg (4 mg Intravenous Given 09/07/22 1235)  iohexol (OMNIPAQUE) 300 MG/ML solution 100 mL (100 mLs Intravenous Contrast Given 09/07/22 1239)  droperidol (INAPSINE) 2.5 MG/ML injection 2.5 mg (2.5 mg Intravenous Given 09/07/22 1535)     IMPRESSION / MDM / ASSESSMENT AND PLAN / ED COURSE  I reviewed the triage vital signs and the nursing notes.                              67 y.o. female with past medical history of hypertension, hyperlipidemia, and PAD who presents to the ED complaining of 1 week of nausea, vomiting, and crampy lower abdominal pain with diarrhea.  Patient's presentation is most consistent with acute presentation with potential threat to life or bodily function.  Differential diagnosis includes, but is not limited to, gastroenteritis, dehydration, electrolyte abnormality, AKI, arrhythmia, UTI, kidney stone, appendicitis, diverticulitis, colitis.  Patient nontoxic-appearing and in no acute distress, vital signs remarkable for bradycardia but otherwise reassuring.  EKG shows sinus bradycardia with no ischemic changes, documentation from the [place] is limited but it appears that she takes amlodipine for her blood pressure, no apparent AV nodal blocking agents.  Labs show leukocytosis with no significant anemia, renal function stable compared to previous with no acute electrolyte abnormality.  She has some hyperglycemia but no evidence of DKA, LFTs and lipase are unremarkable.  Troponin within normal limits and I doubt ACS.  Patient feeling slightly better following dose of Zofran but continues to have nausea and we will treat with droperidol.  CT imaging shows proximal left ureteral stone with mild dilation, although patient continues to deny any flank pain.  Liver lesions noted that were stable compared to 2019, doubt these are contributing to her presentation today.  Patient also with possible extruding lumbar disc, no back pain or lower  extremity neurologic symptoms that would make me think this is an acute finding and may be further worked up as an outpatient.  Patient turned over to oncoming rider pending urinalysis results and reassessment for ongoing nausea.      FINAL CLINICAL IMPRESSION(S) / ED DIAGNOSES   Final diagnoses:  Nausea and vomiting, unspecified vomiting type  Lower abdominal pain  Kidney stone  Bradycardia     Rx / DC Orders   ED Discharge Orders     None        Note:  This document was prepared using Dragon voice recognition software and may include unintentional dictation errors.  Chesley Noon, MD 09/07/22 [phone]

## 2022-09-07 NOTE — ED Notes (Signed)
Patietn in hall placed on monitor and hr is 30 and up to 45.  All complexes have p waves.  She is clammy feeling and keeps wanting to get up.  Instructed to stay in bed due to clamminess.  IV started and given warm blankets.

## 2022-09-07 NOTE — Discharge Instructions (Addendum)
Please call Dr. Keane Scrape clinic tomorrow at 9 AM.  Please let them know you are to see him tomorrow for an ER follow-up at his recommendation.  They will schedule you into see him tomorrow.  Return to the ER right away if you develop fever, worsening symptoms, vomiting, severe pain, weakness, or other concerns arise.  It is extremely important you take your prescribed antibiotic and follow-up with urologist tomorrow.  If you are unable to be seen by urology tomorrow, please return to the ER for reevaluation

## 2022-09-07 NOTE — ED Provider Notes (Signed)
Vitals:   09/07/22 1132 09/07/22 1211  BP: (!) 165/114 95/79  Pulse: 78 (!) 54  Resp: (!) 22 19  Temp: (!) 97.5 F (36.4 C)   SpO2: 98% 100%     Patient is resting in no distress.  She reports she would like something to eat.  I provided her snacks and juice and she was able to eat these and drink these.  Her symptoms are improved.  Nausea controlled.  She is able to maintain a diet.  She had 1 blood pressure that was low but the patient is noted to be laying on her left side at the time.  After readjusting the cuff her blood pressure is actually hypertensive.  Discussed with her options, and given her symptomatology I also consulted with Dr. Irish Elders our urologist.  He reviewed her medical workup and imaging.  He recommends follow-up tomorrow with him at the urology clinic.  I discussed with the patient and she is agreeable to this plan, she is alert nontoxic-appearing at this time.  Appears appropriate for follow-up anticipated tomorrow with urology.  Urology clinic to call her and she also knows to call and make an appointment for tomorrow.  Return precautions and treatment recommendations and follow-up discussed with the patient who is agreeable with the plan.    Sharyn Creamer, MD 09/07/22 1945

## 2022-09-07 NOTE — ED Notes (Signed)
Pt Dc to home. DC instructions reviewed with all questions answered. Pt voices understanding. No IV in place upon this nurse entering room. Prior verbal report from offgoing shift noted that pt had previously removed IV herself on accident.  Pt ambulatory out of dept with steady gait

## 2022-09-07 NOTE — ED Notes (Signed)
Pt moved to room 14. Dr Larinda Buttery called to bedside to evaluate pt.

## 2022-09-08 ENCOUNTER — Telehealth: Payer: Self-pay

## 2022-09-08 ENCOUNTER — Ambulatory Visit (INDEPENDENT_AMBULATORY_CARE_PROVIDER_SITE_OTHER): Payer: Medicaid Other | Admitting: Urology

## 2022-09-08 ENCOUNTER — Other Ambulatory Visit: Payer: Self-pay | Admitting: Urology

## 2022-09-08 ENCOUNTER — Encounter: Payer: Self-pay | Admitting: Urology

## 2022-09-08 VITALS — BP 175/78 | HR 67 | Ht 63.0 in | Wt 137.0 lb

## 2022-09-08 DIAGNOSIS — R110 Nausea: Secondary | ICD-10-CM

## 2022-09-08 DIAGNOSIS — N1330 Unspecified hydronephrosis: Secondary | ICD-10-CM | POA: Diagnosis not present

## 2022-09-08 DIAGNOSIS — N201 Calculus of ureter: Secondary | ICD-10-CM | POA: Diagnosis not present

## 2022-09-08 DIAGNOSIS — N200 Calculus of kidney: Secondary | ICD-10-CM

## 2022-09-08 LAB — URINE CULTURE: Culture: <10000 — AB

## 2022-09-08 NOTE — H&P (View-Only) (Signed)
   09/08/22 12:52 PM   Kayla Haynes 02/09/1956 [id number]  CC: Nausea, 7mm left UPJ stone  HPI: 66-year-old female who presented to ER yesterday with severe nausea.  She really denies any significant abdominal pain or flank pain.  She reports she has intermittent issues with severe nausea that tend to resolve spontaneously.  Evaluation with CT showed a 7 mm left UPJ stone with mild to moderate hydronephrosis, Urinalysis was relatively benign with no bacteria, 6-10 WBC, greater than 50 RBC, trace leukocytes, nitrite negative.  She was discharged with urology follow-up.  She denies any urinary symptoms or dysuria or fevers.  PMH: Past Medical History:  Diagnosis Date   High cholesterol    Hypertension     Surgical History: Past Surgical History:  Procedure Laterality Date   ABDOMINAL HYSTERECTOMY     CHOLECYSTECTOMY      Social History:  reports that she has never smoked. She has never used smokeless tobacco. She reports current alcohol use. She reports current drug use. Drug: Marijuana.  Physical Exam: BP (!) 175/78   Pulse 67   Ht 5' 3" (1.6 m)   Wt 137 lb (62.1 kg)   BMI 24.27 kg/m    Constitutional:  Alert and oriented, No acute distress. Cardiovascular: No clubbing, cyanosis, or edema. Respiratory: Normal respiratory effort, no increased work of breathing. GI: Abdomen is soft, nontender, nondistended, no abdominal masses   Laboratory Data: See HPI  Pertinent Imaging: I have personally viewed and interpreted the CT dated 09/07/2022 showing a 7 mm left UPJ stone lodged at the junction of upper and lower collecting system and proximal ureter with hydronephrosis, 1500HU, 8cm SSD, clearly seen on scout CT  Assessment & Plan:    We discussed various treatment options for urolithiasis including observation with or without medical expulsive therapy, shockwave lithotripsy (SWL), ureteroscopy and laser lithotripsy with stent placement, and percutaneous nephrolithotomy.We  discussed that management is based on stone size, location, density, patient co-morbidities, and patient preference. Stones <5mm in size have a >80% spontaneous passage rate. Data surrounding the use of tamsulosin for medical expulsive therapy is controversial, but meta analyses suggests it is most efficacious for distal stones between 5-10mm in size. Possible side effects include dizziness/lightheadedness, and retrograde ejaculation.SWL has a lower stone free rate in a single procedure, but also a lower complication rate compared to ureteroscopy and avoids a stent and associated stent related symptoms. Possible complications include renal hematoma, steinstrasse, and need for additional treatment.Ureteroscopy with laser lithotripsy and stent placement has a higher stone free rate than SWL in a single procedure, however increased complication rate including possible infection, ureteral injury, bleeding, and stent related morbidity. Common stent related symptoms include dysuria, urgency/frequency, and flank pain.  I do feel the 7 mm left-sided stone with hydronephrosis is the likely cause of her nausea.  We discussed possible persistent nausea despite stone treatment and she understands this risk.  Regardless, we discussed the risk of renal damage if an obstructing stone is left untreated.  She agrees to pursue treatment and would like to pursue shockwave, she understands her stone is quite dense and may require additional treatments, but she is unwilling to undergo ureteroscopy at this time  Schedule left shockwave lithotripsy  Sian Joles, MD 09/08/2022  [facility] [address], [address] [place], [place] [address] [phone]   

## 2022-09-08 NOTE — Progress Notes (Signed)
   09/08/22 12:52 PM   Kayla Haynes 1955/06/08 [id number]  CC: Nausea, 7mm left UPJ stone  HPI: 66 year old female who presented to ER yesterday with severe nausea.  She really denies any significant abdominal pain or flank pain.  She reports she has intermittent issues with severe nausea that tend to resolve spontaneously.  Evaluation with CT showed a 7 mm left UPJ stone with mild to moderate hydronephrosis, Urinalysis was relatively benign with no bacteria, 6-10 WBC, greater than 50 RBC, trace leukocytes, nitrite negative.  She was discharged with urology follow-up.  She denies any urinary symptoms or dysuria or fevers.  PMH: Past Medical History:  Diagnosis Date   High cholesterol    Hypertension     Surgical History: Past Surgical History:  Procedure Laterality Date   ABDOMINAL HYSTERECTOMY     CHOLECYSTECTOMY      Social History:  reports that she has never smoked. She has never used smokeless tobacco. She reports current alcohol use. She reports current drug use. Drug: Marijuana.  Physical Exam: BP (!) 175/78   Pulse 67   Ht 5\' 3"  (1.6 m)   Wt 137 lb (62.1 kg)   BMI 24.27 kg/m    Constitutional:  Alert and oriented, No acute distress. Cardiovascular: No clubbing, cyanosis, or edema. Respiratory: Normal respiratory effort, no increased work of breathing. GI: Abdomen is soft, nontender, nondistended, no abdominal masses   Laboratory Data: See HPI  Pertinent Imaging: I have personally viewed and interpreted the CT dated 09/07/2022 showing a 7 mm left UPJ stone lodged at the junction of upper and lower collecting system and proximal ureter with hydronephrosis, 1500HU, 8cm SSD, clearly seen on scout CT  Assessment & Plan:    We discussed various treatment options for urolithiasis including observation with or without medical expulsive therapy, shockwave lithotripsy (SWL), ureteroscopy and laser lithotripsy with stent placement, and percutaneous nephrolithotomy.We  discussed that management is based on stone size, location, density, patient co-morbidities, and patient preference. Stones <53mm in size have a >80% spontaneous passage rate. Data surrounding the use of tamsulosin for medical expulsive therapy is controversial, but meta analyses suggests it is most efficacious for distal stones between 5-56mm in size. Possible side effects include dizziness/lightheadedness, and retrograde ejaculation.SWL has a lower stone free rate in a single procedure, but also a lower complication rate compared to ureteroscopy and avoids a stent and associated stent related symptoms. Possible complications include renal hematoma, steinstrasse, and need for additional treatment.Ureteroscopy with laser lithotripsy and stent placement has a higher stone free rate than SWL in a single procedure, however increased complication rate including possible infection, ureteral injury, bleeding, and stent related morbidity. Common stent related symptoms include dysuria, urgency/frequency, and flank pain.  I do feel the 7 mm left-sided stone with hydronephrosis is the likely cause of her nausea.  We discussed possible persistent nausea despite stone treatment and she understands this risk.  Regardless, we discussed the risk of renal damage if an obstructing stone is left untreated.  She agrees to pursue treatment and would like to pursue shockwave, she understands her stone is quite dense and may require additional treatments, but she is unwilling to undergo ureteroscopy at this time  Schedule left shockwave lithotripsy  Legrand Rams, MD 09/08/2022  [facility] [address], [address] [place], [place] [address] [phone]

## 2022-09-08 NOTE — Telephone Encounter (Signed)
Received the following message from Dr. Richardo Hanks: please offer 1pm today, large ureteral stone and goal to get her set up for SWL Thursday. No KUB or UA needed.  Called pt to offer appointment. No answer. Unable to leave message as no voicemail is set up. 1st attempt.

## 2022-09-08 NOTE — Telephone Encounter (Signed)
Pt called back. Pt scheduled

## 2022-09-08 NOTE — Progress Notes (Unsigned)
ESWL ORDER FORM  Expected date of procedure: 09/16/2022  Surgeon: MD on truck  Post op standing: 2-4wk follow up w/KUB prior  Anticoagulation/Aspirin/NSAID standing order: Hold all 72 hours prior  Anesthesia standing order: MAC  VTE standing: SCD's  Dx: Left Ureteral Stone  Procedure: left Extracorporeal shock wave lithotripsy  CPT : 50590  Standing Order Set:   *NPO after mn, KUB  *NS 158ml/hr, Keflex 500mg  PO, Benadryl 25mg  PO, Valium 10mg  PO, Zofran 4mg  IV    Medications if other than standing orders:   NONE

## 2022-09-08 NOTE — Patient Instructions (Signed)
ESWL for Kidney Stones  Extracorporeal shock wave lithotripsy (ESWL) is a treatment that can help break up kidney stones that are too large to pass on their own.  This is a nonsurgical procedure that breaks up a kidney stone with shock waves. These shock waves pass through your body and focus on the kidney stone. They cause the kidney stone to break into smaller pieces (fragments) while it is still in the urinary tract. The fragments of stone can pass more easily out of your body in the urine. Tell a health care provider about: Any allergies you have. All medicines you are taking, including vitamins, herbs, eye drops, creams, and over-the-counter medicines. Any problems you or family members have had with anesthetic medicines. Any bleeding problems you have. Any surgeries you have had. Any medical conditions you have. Whether you are pregnant or may be pregnant. What are the risks? Your health care provider will talk with you about risks. These may include: Infection. Bleeding from the kidney. Bruising of the kidney or skin. Scarring of the kidney. This can lead to: Increased blood pressure. Poor kidney function. Return (recurrence) of kidney stones. Damage to other structures or organs. This may include the liver, colon, spleen, or pancreas. Blockage (obstruction) of the tube that carries urine from the kidney to the bladder (ureter). Failure of the kidney stone to break into fragments. What happens before the procedure? When to stop eating and drinking Follow instructions from your health care provider about what you may eat and drink. These may include: 8 hours before your procedure Stop eating most foods. Do not eat meat, fried foods, or fatty foods. Eat only light foods, such as toast or crackers. All liquids are okay except energy drinks and alcohol. 6 hours before your procedure Stop eating. Drink only clear liquids, such as water, clear fruit juice, black coffee, plain tea,  and sports drinks. Do not drink energy drinks or alcohol. 2 hours before your procedure Stop drinking all liquids. You may be allowed to take medicines with small sips of water. If you do not follow your health care provider's instructions, your procedure may be delayed or canceled. Medicines Ask your health care provider about: Changing or stopping your regular medicines. These include any diabetes medicines or blood thinners you take. Taking medicines such as aspirin and ibuprofen. These medicines can thin your blood. Do not take them unless your health care provider tells you to. Taking over-the-counter medicines, vitamins, herbs, and supplements. Tests You may have tests, such as: Blood tests. Urine tests. Imaging tests. This may include a CT scan. Surgery safety Ask your health care provider: How your surgery site will be marked. What steps will be taken to help prevent infection. These steps may include: Washing skin with a soap that kills germs. Receiving antibiotics. General instructions If you will be going home right after the procedure, plan to have a responsible adult: Take you home from the hospital or clinic. You will not be allowed to drive. Care for you for the time you are told. What happens during the procedure?  An IV will be inserted into one of your veins. You may be given: A sedative. This helps you relax. Anesthesia. This will: Numb certain areas of your body. Make you fall asleep for surgery. A water-filled cushion may be placed behind your kidney or on your abdomen. In some cases, you may be placed in a tub of lukewarm water. Your body will be positioned in a way that makes it  easier to target the kidney stone. An X-ray or ultrasound exam will be done to locate your stone. Shock waves will be aimed at the stone. If you are awake, you may feel a tapping sensation as the shock waves pass through your body. A small mesh tube (stent) may be placed in your  ureter. This will help keep urine flowing from the kidney if the fragments of the stone have been blocking the ureter. The stent will be removed at a later time by your health care provider. The procedure may vary among health care providers and hospitals. What happens after the procedure? Your blood pressure, heart rate, breathing rate, and blood oxygen level will be monitored until you leave the hospital or clinic. You may have an X-ray after the procedure to see how many of the kidney stones were broken up. This will also show how much of the stone has passed. If there are still large fragments after treatment, you may need to have a second procedure at a later time. This information is not intended to replace advice given to you by your health care provider. Make sure you discuss any questions you have with your health care provider. Document Revised: 07/30/2021 Document Reviewed: 07/30/2021 Elsevier Patient Education  2024 [employer]  ESWL for Kidney Stones, Care After The following information offers guidance on how to care for yourself after your procedure. Your health care provider may also give you more specific instructions. If you have problems or questions, contact your health care provider. What can I expect after the procedure? After the procedure, it is common to have: Some blood in your urine. This should only last for a few days. Soreness in your back, sides, or upper abdomen for a few days. Blotches or bruises on the area where the shock wave entered the skin. Pain, discomfort, or nausea when pieces (fragments) of the kidney stone move through the tube that carries urine from the kidney to the bladder (ureter). Fragments may pass soon after the procedure. They may also take up to 4-8 weeks to pass. If you have severe pain or nausea, contact your health care provider. This may be caused by a large stone that was not broken up enough. This may mean that you need more  treatment. Some pain or discomfort during urination. Some pain or discomfort in the lower abdomen or at the base of the penis. Follow these instructions at home: Medicines  Take over-the-counter and prescription medicines only as told by your health care provider. If you were prescribed antibiotics, take them as told by your health care provider. Do not stop using the antibiotic even if you start to feel better. Ask your health care provider if the medicine prescribed to you: Requires you to avoid driving or using machinery. Can cause constipation. You may need to take these actions to prevent or treat constipation: Take over-the-counter or prescription medicines. Eat foods that are high in fiber, such as beans, whole grains, and fresh fruits and vegetables. Limit foods that are high in fat and processed sugars, such as fried or sweet foods. Eating and drinking  Follow instructions from your health care provider about what you may eat and drink. You may be told to: Reduce how much salt (sodium) you eat or drink. Check ingredients and nutrition facts on packaged foods and drinks to see how much sodium they contain. Reduce how much meat you eat. Drink enough fluid to keep your urine pale yellow. This can help you pass  any pieces of the stone that are left. It can also prevent new stones from forming. Eat plenty of fresh fruits and vegetables. Eat the recommended amount of calcium for your age and gender. Ask your health care provider how much calcium you should have. Activity Get plenty of rest as told by your health care provider. Avoid sitting for a long time without moving. Get up to take short walks every 1-2 hours. This is important to improve blood flow and breathing. Ask for help if you feel weak or unsteady. Your health care provider may tell you to lie in a certain position (postural drainage) and tap firmly (percuss) over your kidney area to help stone fragments pass. Follow  instructions as told by your health care provider. Return to your normal activities as told by your health care provider. Ask your health care provider what activities are safe for you. Most people can resume normal activities 1-2 days after the procedure. General instructions If told, strain all urine through the strainer that was provided by your health care provider. Keep all fragments for your health care provider to see. Any stones that are found may be sent to a medical lab for examination. The stone may be as small as a grain of salt. Keep all follow-up visits. This is important if you had a stent placed because it may need to stay in place for a few weeks. Ask your health care provider when the stent will be removed. Contact a health care provider if: You have a fever or chills. You have severe nausea that leads to persistent vomiting. You have any of these urinary symptoms: Increased blood or blood clots in the urine. Urine that smells bad or unusual. A strong urge to urinate after emptying your bladder. Pain or burning with urination that does not go away. A continued need to urinate more often than usual. You have a stent, and it comes out. Get help right away if: You have severe pain in your back, sides, or upper abdomen. You faint. You have any of these urinary symptoms: Severe pain while urinating. More blood in your urine, or blood in your urine when you did not have any before. Blood clots in your urine larger than 1 inch (2.5 cm) in size. You pass only a small amount of urine when you urinate or are unable to pass any urine. This information is not intended to replace advice given to you by your health care provider. Make sure you discuss any questions you have with your health care provider. Document Revised: 07/30/2021 Document Reviewed: 07/30/2021 Elsevier Patient Education  2024 [employer].

## 2022-09-15 MED ORDER — ONDANSETRON HCL 4 MG/2ML IJ SOLN
4.0000 mg | Freq: Once | INTRAMUSCULAR | Status: AC
  Administered 2022-09-16: 4 mg via INTRAVENOUS

## 2022-09-15 MED ORDER — CEPHALEXIN 500 MG PO CAPS
500.0000 mg | ORAL_CAPSULE | Freq: Once | ORAL | Status: AC
  Administered 2022-09-16: 500 mg via ORAL

## 2022-09-15 MED ORDER — DIPHENHYDRAMINE HCL 25 MG PO CAPS
25.0000 mg | ORAL_CAPSULE | ORAL | Status: AC
  Administered 2022-09-16: 25 mg via ORAL

## 2022-09-15 MED ORDER — SODIUM CHLORIDE 0.9 % IV SOLN: INTRAVENOUS | Status: DC

## 2022-09-15 MED ORDER — DIAZEPAM 5 MG PO TABS
10.0000 mg | ORAL_TABLET | ORAL | Status: AC
  Administered 2022-09-16: 10 mg via ORAL

## 2022-09-16 ENCOUNTER — Encounter: Payer: Self-pay | Admitting: Urology

## 2022-09-16 ENCOUNTER — Other Ambulatory Visit: Payer: Self-pay

## 2022-09-16 ENCOUNTER — Ambulatory Visit: Payer: Medicaid Other

## 2022-09-16 ENCOUNTER — Encounter
Admission: RE | Disposition: A | Discharge status: Home / Self Care | Payer: Self-pay | Source: Home / Self Care | Attending: Urology

## 2022-09-16 ENCOUNTER — Ambulatory Visit
Admission: RE | Admit: 2022-09-16 | Discharge: 2022-09-16 | Disposition: A | Discharge status: Home / Self Care | Payer: Medicaid Other | Attending: Urology | Admitting: Urology

## 2022-09-16 DIAGNOSIS — N200 Calculus of kidney: Secondary | ICD-10-CM | POA: Insufficient documentation

## 2022-09-16 DIAGNOSIS — I10 Essential (primary) hypertension: Secondary | ICD-10-CM | POA: Diagnosis not present

## 2022-09-16 DIAGNOSIS — N201 Calculus of ureter: Secondary | ICD-10-CM

## 2022-09-16 HISTORY — PX: EXTRACORPOREAL SHOCK WAVE LITHOTRIPSY: SHX1557

## 2022-09-16 SURGERY — LITHOTRIPSY, ESWL
Anesthesia: Moderate Sedation | Laterality: Left

## 2022-09-16 MED ORDER — DIPHENHYDRAMINE HCL 25 MG PO CAPS
ORAL_CAPSULE | ORAL | Status: AC
  Filled 2022-09-16: qty 1

## 2022-09-16 MED ORDER — CEPHALEXIN 500 MG PO CAPS
ORAL_CAPSULE | ORAL | Status: AC
  Filled 2022-09-16: qty 1

## 2022-09-16 MED ORDER — ONDANSETRON HCL 4 MG/2ML IJ SOLN
INTRAMUSCULAR | Status: AC
  Filled 2022-09-16: qty 2

## 2022-09-16 MED ORDER — DIAZEPAM 5 MG PO TABS
ORAL_TABLET | ORAL | Status: AC
  Filled 2022-09-16: qty 2

## 2022-09-16 MED ORDER — HYDROCODONE-ACETAMINOPHEN 5-325 MG PO TABS: 1.0000 | ORAL_TABLET | Freq: Four times a day (QID) | ORAL | 0 refills | Status: DC | PRN

## 2022-09-16 MED ORDER — TAMSULOSIN HCL 0.4 MG PO CAPS: 0.4000 mg | ORAL_CAPSULE | Freq: Every day | ORAL | 0 refills | Status: DC

## 2022-09-16 NOTE — Discharge Instructions (Signed)
AMBULATORY SURGERY  ?DISCHARGE INSTRUCTIONS ? ? ?The drugs that you were given will stay in your system until tomorrow so for the next 24 hours you should not: ? ?Drive an automobile ?Make any legal decisions ?Drink any alcoholic beverage ? ? ?You may resume regular meals tomorrow.  Today it is better to start with liquids and gradually work up to solid foods. ? ?You may eat anything you prefer, but it is better to start with liquids, then soup and crackers, and gradually work up to solid foods. ? ? ?Please notify your doctor immediately if you have any unusual bleeding, trouble breathing, redness and pain at the surgery site, drainage, fever, or pain not relieved by medication. ? ? ? ?Additional Instructions: ? ? ? ?Please contact your physician with any problems or Same Day Surgery at [phone], Monday through Friday 6 am to 4 pm, or [place] [facility] [place] Main number at [phone].  ?

## 2022-09-16 NOTE — Interval H&P Note (Signed)
History and Physical Interval Note:  09/16/2022 8:08 AM  Kayla Haynes  has presented today for surgery, with the diagnosis of Left Ureteral Stone.  The various methods of treatment have been discussed with the patient and family. After consideration of risks, benefits and other options for treatment, the patient has consented to  Procedure(s): EXTRACORPOREAL SHOCK WAVE LITHOTRIPSY (ESWL) (Left) as a surgical intervention.  The patient's history has been reviewed, patient examined, no change in status, stable for surgery.  I have reviewed the patient's chart and labs.  Questions were answered to the patient's satisfaction.    RRR CTAB   Vanna Scotland

## 2022-09-17 ENCOUNTER — Encounter: Payer: Self-pay | Admitting: Urology

## 2022-09-30 ENCOUNTER — Ambulatory Visit
Admission: RE | Admit: 2022-09-30 | Discharge: 2022-09-30 | Disposition: A | Discharge status: Home / Self Care | Payer: Medicaid Other | Source: Ambulatory Visit | Attending: Urology | Admitting: Urology

## 2022-09-30 ENCOUNTER — Ambulatory Visit (INDEPENDENT_AMBULATORY_CARE_PROVIDER_SITE_OTHER): Payer: Medicaid Other | Admitting: Physician Assistant

## 2022-09-30 VITALS — BP 120/74 | HR 60 | Ht 63.0 in | Wt 136.0 lb

## 2022-09-30 DIAGNOSIS — N201 Calculus of ureter: Secondary | ICD-10-CM

## 2022-09-30 DIAGNOSIS — Z87442 Personal history of urinary calculi: Secondary | ICD-10-CM

## 2022-09-30 DIAGNOSIS — Z09 Encounter for follow-up examination after completed treatment for conditions other than malignant neoplasm: Secondary | ICD-10-CM

## 2022-09-30 LAB — URINALYSIS, COMPLETE
Bilirubin, UA: NEGATIVE
Glucose, UA: NEGATIVE
Leukocytes,UA: NEGATIVE
Nitrite, UA: NEGATIVE
RBC, UA: NEGATIVE
Specific Gravity, UA: 1.015 (ref 1.005–1.030)
Urobilinogen, Ur: 4 mg/dL — ABNORMAL HIGH (ref 0.2–1.0)
pH, UA: 6 (ref 5.0–7.5)

## 2022-09-30 LAB — MICROSCOPIC EXAMINATION: Epithelial Cells (non renal): >10 /hpf — AB (ref 0–10)

## 2022-09-30 NOTE — Progress Notes (Signed)
09/30/2022 3:17 PM   Kayla Haynes Feb 10, 1956 [id number]  CC: Chief Complaint  Patient presents with   Follow-up   HPI: Kayla Haynes is a 67 y.o. female who underwent ESWL with Dr. Apolinar Junes on 09/16/2022 for management of a 7 mm left UPJ stone who presents today for postop follow-up.  Operative note describes fragmentation of the stone.   Today she reports she strain her urine, but never saw any stone fragments or dust pass.  Her pain and nausea have resolved and she denies gross hematuria.  KUB today with interval resolution of the left UPJ stone.  In-office UA today positive for trace ketones, trace protein, and 4.0 EU/DL urobilinogen; urine microscopy with >10 epithelial cells/hpf and many bacteria.  PMH: Past Medical History:  Diagnosis Date   High cholesterol    Hypertension     Surgical History: Past Surgical History:  Procedure Laterality Date   ABDOMINAL HYSTERECTOMY     CHOLECYSTECTOMY     EXTRACORPOREAL SHOCK WAVE LITHOTRIPSY Left 09/16/2022   Procedure: EXTRACORPOREAL SHOCK WAVE LITHOTRIPSY (ESWL);  Surgeon: Vanna Scotland, MD;  Location: [facility] ORS;  Service: Urology;  Laterality: Left;    Home Medications:  Allergies as of 09/30/2022   No Known Allergies      Medication List        Accurate as of September 30, 2022  3:17 PM. If you have any questions, ask your nurse or doctor.          amLODipine 5 MG tablet Commonly known as: NORVASC Take 5 mg by mouth daily.   atorvastatin 80 MG tablet Commonly known as: LIPITOR Take 80 mg by mouth daily.   cephALEXin 500 MG capsule Commonly known as: KEFLEX Take 1 capsule (500 mg total) by mouth 2 (two) times daily.   Cholecalciferol 50 MCG (2000 UT) Tabs Take 1 tablet by mouth daily.   dimenhyDRINATE 50 MG tablet Commonly known as: DRAMAMINE Take 50 mg by mouth every 8 (eight) hours as needed for nausea.   HYDROcodone-acetaminophen 5-325 MG tablet Commonly known as: NORCO/VICODIN Take 1-2 tablets by  mouth every 6 (six) hours as needed for moderate pain.   ibuprofen 200 MG tablet Commonly known as: ADVIL Take 200 mg by mouth every 6 (six) hours as needed for mild pain.   irbesartan 300 MG tablet Commonly known as: AVAPRO Take 300 mg by mouth daily.   ondansetron 4 MG disintegrating tablet Commonly known as: ZOFRAN-ODT Take 1 tablet (4 mg total) by mouth every 6 (six) hours as needed for nausea or vomiting.   promethazine 25 MG tablet Commonly known as: PHENERGAN Take 1 tablet (25 mg total) by mouth every 6 (six) hours as needed for nausea or vomiting.   tamsulosin 0.4 MG Caps capsule Commonly known as: Flomax Take 1 capsule (0.4 mg total) by mouth daily.        Allergies:  No Known Allergies  Family History: No family history on file.  Social History:   reports that she has never smoked. She has never been exposed to tobacco smoke. She has never used smokeless tobacco. She reports current alcohol use. She reports current drug use. Drug: Marijuana.  Physical Exam: BP 120/74   Pulse 60   Ht 5\' 3"  (1.6 m)   Wt 136 lb (61.7 kg)   BMI 24.09 kg/m   Constitutional:  Alert and oriented, no acute distress, nontoxic appearing HEENT: [place], AT Cardiovascular: No clubbing, cyanosis, or edema Respiratory: Normal respiratory effort, no increased work of breathing  Skin: No rashes, bruises or suspicious lesions Neurologic: Grossly intact, no focal deficits, moving all 4 extremities Psychiatric: Normal mood and affect  Laboratory Data: Results for orders placed or performed in visit on 09/30/22  Microscopic Examination   Urine  Result Value Ref Range   WBC, UA 0-5 0 - 5 /hpf   RBC, Urine 0-2 0 - 2 /hpf   Epithelial Cells (non renal) >10 (A) 0 - 10 /hpf   Mucus, UA Present (A) Not Estab.   Bacteria, UA Many (A) None seen/Few  Urinalysis, Complete  Result Value Ref Range   Specific Gravity, UA 1.015 1.005 - 1.030   pH, UA 6.0 5.0 - 7.5   Color, UA Yellow Yellow    Appearance Ur Clear Clear   Leukocytes,UA Negative Negative   Protein,UA Trace (A) Negative/Trace   Glucose, UA Negative Negative   Ketones, UA Trace (A) Negative   RBC, UA Negative Negative   Bilirubin, UA Negative Negative   Urobilinogen, Ur 4.0 (H) 0.2 - 1.0 mg/dL   Nitrite, UA Negative Negative   Microscopic Examination See below:    Pertinent Imaging: KUB, 09/30/2022: CLINICAL DATA:  Proximal left ureteral stone.   EXAM: ABDOMEN - 1 VIEW   COMPARISON:  09/16/2022.   FINDINGS: The bowel gas pattern is normal. Left-sided stone observed on prior studies is not seen currently.   IMPRESSION: No radiopaque stones are seen.     Electronically Signed   By: Layla Maw M.D.   On: 10/05/2022 19:29  I personally reviewed the images referenced above and note interval resolution of the left UPJ stone.  Assessment & Plan:   1. Left ureteral stone Symptoms have resolved, UA is bland, and interval resolution of the stone on KUB, however she never saw any stone fragments passed and is concerned about retained pieces.  Given the size of the stone, I do think it is a bit strange that she did not notice anything pass, so I offered her renal ultrasound to look for any hydronephrosis that would be consistent with retained stone/fragments.  She is in agreement.  Will call her with results. - Urinalysis, Complete - US RENAL; Future   Return for Will call with results.  Carman Ching, PA-C  [facility] [place] [address], [address] [place], [place] [address] [phone]

## 2022-10-12 ENCOUNTER — Ambulatory Visit
Admission: RE | Admit: 2022-10-12 | Discharge: 2022-10-12 | Disposition: A | Discharge status: Home / Self Care | Payer: Medicaid Other | Source: Ambulatory Visit | Attending: Physician Assistant | Admitting: Physician Assistant

## 2022-10-12 DIAGNOSIS — N201 Calculus of ureter: Secondary | ICD-10-CM | POA: Diagnosis present

## 2022-10-13 ENCOUNTER — Other Ambulatory Visit: Payer: Self-pay | Admitting: Urology

## 2022-10-13 ENCOUNTER — Emergency Department: Payer: Non-veteran care

## 2022-10-13 ENCOUNTER — Emergency Department
Admission: EM | Admit: 2022-10-13 | Discharge: 2022-10-13 | Disposition: A | Discharge status: Home / Self Care | Payer: Non-veteran care | Attending: Emergency Medicine | Admitting: Emergency Medicine

## 2022-10-13 ENCOUNTER — Other Ambulatory Visit: Payer: Self-pay

## 2022-10-13 ENCOUNTER — Encounter: Payer: Self-pay | Admitting: Radiology

## 2022-10-13 DIAGNOSIS — Z87442 Personal history of urinary calculi: Secondary | ICD-10-CM | POA: Insufficient documentation

## 2022-10-13 DIAGNOSIS — I10 Essential (primary) hypertension: Secondary | ICD-10-CM | POA: Diagnosis not present

## 2022-10-13 DIAGNOSIS — R1013 Epigastric pain: Secondary | ICD-10-CM | POA: Diagnosis not present

## 2022-10-13 DIAGNOSIS — R112 Nausea with vomiting, unspecified: Secondary | ICD-10-CM | POA: Insufficient documentation

## 2022-10-13 LAB — COMPREHENSIVE METABOLIC PANEL
ALT: 12 U/L (ref 0–44)
AST: 27 U/L (ref 15–41)
Albumin: 4.6 g/dL (ref 3.5–5.0)
Alkaline Phosphatase: 113 U/L (ref 38–126)
Anion gap: 14 (ref 5–15)
BUN: 20 mg/dL (ref 8–23)
CO2: 23 mmol/L (ref 22–32)
Calcium: 9.9 mg/dL (ref 8.9–10.3)
Chloride: 94 mmol/L — ABNORMAL LOW (ref 98–111)
Creatinine, Ser: 1.12 mg/dL — ABNORMAL HIGH (ref 0.44–1.00)
GFR, Estimated: 55 mL/min — ABNORMAL LOW (ref >60)
Glucose, Bld: 171 mg/dL — ABNORMAL HIGH (ref 70–99)
Potassium: 3.8 mmol/L (ref 3.5–5.1)
Sodium: 131 mmol/L — ABNORMAL LOW (ref 135–145)
Total Bilirubin: 1.1 mg/dL (ref 0.3–1.2)
Total Protein: 9.3 g/dL — ABNORMAL HIGH (ref 6.5–8.1)

## 2022-10-13 LAB — CBC
HCT: 37 % (ref 36.0–46.0)
Hemoglobin: 11.7 g/dL — ABNORMAL LOW (ref 12.0–15.0)
MCH: 23.4 pg — ABNORMAL LOW (ref 26.0–34.0)
MCHC: 31.6 g/dL (ref 30.0–36.0)
MCV: 73.9 fL — ABNORMAL LOW (ref 80.0–100.0)
Platelets: 511 10*3/uL — ABNORMAL HIGH (ref 150–400)
RBC: 5.01 MIL/uL (ref 3.87–5.11)
RDW: 14.8 % (ref 11.5–15.5)
WBC: 8.8 10*3/uL (ref 4.0–10.5)
nRBC: 0 % (ref 0.0–0.2)

## 2022-10-13 LAB — URINALYSIS, ROUTINE W REFLEX MICROSCOPIC
Bilirubin Urine: NEGATIVE
Glucose, UA: NEGATIVE mg/dL
Hgb urine dipstick: NEGATIVE
Ketones, ur: NEGATIVE mg/dL
Leukocytes,Ua: NEGATIVE
Nitrite: NEGATIVE
Protein, ur: 100 mg/dL — AB
Specific Gravity, Urine: 1.017 (ref 1.005–1.030)
Squamous Epithelial / HPF: >50 /HPF (ref 0–5)
pH: 5 (ref 5.0–8.0)

## 2022-10-13 LAB — LIPASE, BLOOD: Lipase: 36 U/L (ref 11–51)

## 2022-10-13 MED ORDER — PROMETHAZINE HCL 25 MG/ML IJ SOLN
12.5000 mg | INTRAMUSCULAR | Status: AC
  Administered 2022-10-13: 12.5 mg via INTRAMUSCULAR
  Filled 2022-10-13: qty 1

## 2022-10-13 MED ORDER — SODIUM CHLORIDE 0.9 % IV BOLUS
1000.0000 mL | Freq: Once | INTRAVENOUS | Status: AC
  Administered 2022-10-13: 1000 mL via INTRAVENOUS

## 2022-10-13 MED ORDER — ONDANSETRON HCL 4 MG/2ML IJ SOLN
4.0000 mg | INTRAMUSCULAR | Status: AC
  Administered 2022-10-13: 4 mg via INTRAVENOUS
  Filled 2022-10-13: qty 2

## 2022-10-13 MED ORDER — HYDROCODONE-ACETAMINOPHEN 5-325 MG PO TABS
1.0000 | ORAL_TABLET | Freq: Once | ORAL | Status: AC
  Administered 2022-10-13: 1 via ORAL
  Filled 2022-10-13: qty 1

## 2022-10-13 MED ORDER — ONDANSETRON 4 MG PO TBDP: 4.0000 mg | ORAL_TABLET | Freq: Four times a day (QID) | ORAL | 0 refills | Status: DC | PRN

## 2022-10-13 NOTE — ED Triage Notes (Signed)
Pt states that she hasn't been able to keep anything down (food or water) since Saturday.

## 2022-10-13 NOTE — Discharge Instructions (Addendum)
? ?  Please return to the emergency room right away if you are to develop a fever, severe nausea, your pain becomes severe or worsens, you are unable to keep food down, begin vomiting any dark or bloody fluid, you develop any dark or bloody stools, feel dehydrated, or other new concerns or symptoms arise. ? ?

## 2022-10-13 NOTE — ED Provider Notes (Signed)
[facility] Provider Note    Event Date/Time   First MD Initiated Contact with Patient 10/13/22 1721     (approximate)   History   Emesis   HPI {Remember to add pertinent medical, surgical, social, and/or OB history to HPI:1} Kayla Haynes is a 67 y.o. female  ***       Physical Exam   Triage Vital Signs: ED Triage Vitals  Enc Vitals Group     BP 10/13/22 1705 (!) 150/91     Pulse Rate 10/13/22 1705 79     Resp 10/13/22 1705 17     Temp 10/13/22 1705 97.9 F (36.6 C)     Temp Source 10/13/22 1705 Oral     SpO2 10/13/22 1705 94 %     Weight 10/13/22 1706 136 lb (61.7 kg)     Height 10/13/22 1706 5\' 3"  (1.6 m)     Head Circumference --      Peak Flow --      Pain Score --      Pain Loc --      Pain Edu? --      Excl. in GC? --     Most recent vital signs: Vitals:   10/13/22 1705  BP: (!) 150/91  Pulse: 79  Resp: 17  Temp: 97.9 F (36.6 C)  SpO2: 94%    {Only need to document appropriate and relevant physical exam:1} General: Awake, no distress. *** CV:  Good peripheral perfusion. *** Resp:  Normal effort. *** Abd:  No distention. *** Other:  ***   ED Results / Procedures / Treatments   Labs (all labs ordered are listed, but only abnormal results are displayed) Labs Reviewed  COMPREHENSIVE METABOLIC PANEL - Abnormal; Notable for the following components:      Result Value   Sodium 131 (*)    Chloride 94 (*)    Glucose, Bld 171 (*)    Creatinine, Ser 1.12 (*)    Total Protein 9.3 (*)    GFR, Estimated 55 (*)    All other components within normal limits  CBC - Abnormal; Notable for the following components:   Hemoglobin 11.7 (*)    MCV 73.9 (*)    MCH 23.4 (*)    Platelets 511 (*)    All other components within normal limits  URINALYSIS, ROUTINE W REFLEX MICROSCOPIC - Abnormal; Notable for the following components:   Color, Urine AMBER (*)    APPearance CLOUDY (*)    Protein, ur 100 (*)    Bacteria, UA RARE (*)     All other components within normal limits  LIPASE, BLOOD     EKG  ***   RADIOLOGY *** {USE THE WORD "INTERPRETED"!! You MUST document your own interpretation of imaging, as well as the fact that you reviewed the radiologist's report!:1}   PROCEDURES:  Critical Care performed: {CriticalCareYesNo:19197::"Yes, see critical care procedure note(s)","No"}  Procedures   MEDICATIONS ORDERED IN ED: Medications - No data to display   IMPRESSION / MDM / ASSESSMENT AND PLAN / ED COURSE  I reviewed the triage vital signs and the nursing notes.                              Differential diagnosis includes, but is not limited to, ***  Patient's presentation is most consistent with {EM COPA:27473}  *** {If the patient is on the monitor, remove the brackets and asterisks on the  sentence below and remember to document it as a Procedure as well. Otherwise delete the sentence below:1} {**The patient is on the cardiac monitor to evaluate for evidence of arrhythmia and/or significant heart rate changes.**} {Remember to include, when applicable, any/all of the following data: independent review of imaging independent review of labs (comment specifically on pertinent positives and negatives) review of specific prior hospitalizations, PCP/specialist notes, etc. discuss meds given and prescribed document any discussion with consultants (including hospitalists) any clinical decision tools you used and why (PECARN, NEXUS, etc.) did you consider admitting the patient? document social determinants of health affecting patient's care (homelessness, inability to follow up in a timely fashion, etc) document any pre-existing conditions increasing risk on current visit (e.g. diabetes and HTN increasing danger of high-risk chest pain/ACS) describes what meds you gave (especially parenteral) and why any other interventions?:1}   ----------------------------------------- 8:31 PM on  10/13/2022 ----------------------------------------- After receiving dose of Zofran patient reports nausea is gone away.  Her daughter is coming to pick her up and she feels ready for discharge.  She was only able to eat 1 saltine crackers and sip of soda, but she is still requesting discharge and states that she will be fine.  She reports that this happened to her several times in the past.  She is willing to get a prescription for Zofran which she has taken in the past  Discussed careful return precautions recommended follow-up with primary care.  She is awake alert oriented no distress.  Reports pain and nausea have completely abated.  Return precautions and treatment recommendations and follow-up discussed with the patient who is agreeable with the plan.   FINAL CLINICAL IMPRESSION(S) / ED DIAGNOSES   Final diagnoses:  None     Rx / DC Orders   ED Discharge Orders     None        Note:  This document was prepared using Dragon voice recognition software and may include unintentional dictation errors.

## 2022-12-29 ENCOUNTER — Emergency Department
Admission: EM | Admit: 2022-12-29 | Discharge: 2022-12-29 | Disposition: A | Discharge status: Home / Self Care | Payer: No Typology Code available for payment source | Attending: Emergency Medicine | Admitting: Emergency Medicine

## 2022-12-29 DIAGNOSIS — E876 Hypokalemia: Secondary | ICD-10-CM | POA: Diagnosis not present

## 2022-12-29 DIAGNOSIS — I10 Essential (primary) hypertension: Secondary | ICD-10-CM | POA: Insufficient documentation

## 2022-12-29 DIAGNOSIS — K529 Noninfective gastroenteritis and colitis, unspecified: Secondary | ICD-10-CM | POA: Diagnosis not present

## 2022-12-29 DIAGNOSIS — R1013 Epigastric pain: Secondary | ICD-10-CM | POA: Diagnosis present

## 2022-12-29 DIAGNOSIS — N179 Acute kidney failure, unspecified: Secondary | ICD-10-CM | POA: Diagnosis not present

## 2022-12-29 LAB — CBC
HCT: 36.4 % (ref 36.0–46.0)
Hemoglobin: 11.5 g/dL — ABNORMAL LOW (ref 12.0–15.0)
MCH: 23.5 pg — ABNORMAL LOW (ref 26.0–34.0)
MCHC: 31.6 g/dL (ref 30.0–36.0)
MCV: 74.4 fL — ABNORMAL LOW (ref 80.0–100.0)
Platelets: 445 10*3/uL — ABNORMAL HIGH (ref 150–400)
RBC: 4.89 MIL/uL (ref 3.87–5.11)
RDW: 14.1 % (ref 11.5–15.5)
WBC: 10.8 10*3/uL — ABNORMAL HIGH (ref 4.0–10.5)
nRBC: 0 % (ref 0.0–0.2)

## 2022-12-29 LAB — COMPREHENSIVE METABOLIC PANEL WITH GFR
ALT: 16 U/L (ref 0–44)
AST: 29 U/L (ref 15–41)
Albumin: 4.8 g/dL (ref 3.5–5.0)
Alkaline Phosphatase: 100 U/L (ref 38–126)
Anion gap: 15 (ref 5–15)
BUN: 22 mg/dL (ref 8–23)
CO2: 17 mmol/L — ABNORMAL LOW (ref 22–32)
Calcium: 9.9 mg/dL (ref 8.9–10.3)
Chloride: 97 mmol/L — ABNORMAL LOW (ref 98–111)
Creatinine, Ser: 1.28 mg/dL — ABNORMAL HIGH (ref 0.44–1.00)
GFR, Estimated: 47 mL/min — ABNORMAL LOW (ref >60)
Glucose, Bld: 252 mg/dL — ABNORMAL HIGH (ref 70–99)
Potassium: 2.6 mmol/L — CL (ref 3.5–5.1)
Sodium: 129 mmol/L — ABNORMAL LOW (ref 135–145)
Total Bilirubin: 1.4 mg/dL — ABNORMAL HIGH (ref 0.3–1.2)
Total Protein: 8.7 g/dL — ABNORMAL HIGH (ref 6.5–8.1)

## 2022-12-29 LAB — MAGNESIUM: Magnesium: 2 mg/dL (ref 1.7–2.4)

## 2022-12-29 LAB — LIPASE, BLOOD: Lipase: 30 U/L (ref 11–51)

## 2022-12-29 LAB — URINALYSIS, ROUTINE W REFLEX MICROSCOPIC
Bilirubin Urine: NEGATIVE
Glucose, UA: NEGATIVE mg/dL
Hgb urine dipstick: NEGATIVE
Ketones, ur: 5 mg/dL — AB
Leukocytes,Ua: NEGATIVE
Nitrite: NEGATIVE
Protein, ur: 30 mg/dL — AB
Specific Gravity, Urine: 1.017 (ref 1.005–1.030)
pH: 5 (ref 5.0–8.0)

## 2022-12-29 MED ORDER — ONDANSETRON 4 MG PO TBDP
4.0000 mg | ORAL_TABLET | Freq: Once | ORAL | Status: AC | PRN
  Administered 2022-12-29: 4 mg via ORAL
  Filled 2022-12-29: qty 1

## 2022-12-29 MED ORDER — DROPERIDOL 2.5 MG/ML IJ SOLN
2.5000 mg | Freq: Once | INTRAMUSCULAR | Status: AC
  Administered 2022-12-29: 2.5 mg via INTRAVENOUS
  Filled 2022-12-29: qty 2

## 2022-12-29 MED ORDER — PROMETHAZINE HCL 12.5 MG PO TABS: 12.5000 mg | ORAL_TABLET | Freq: Four times a day (QID) | ORAL | 0 refills | Status: DC | PRN

## 2022-12-29 MED ORDER — POTASSIUM CHLORIDE CRYS ER 20 MEQ PO TBCR
40.0000 meq | EXTENDED_RELEASE_TABLET | Freq: Once | ORAL | Status: AC
  Administered 2022-12-29: 40 meq via ORAL
  Filled 2022-12-29: qty 2

## 2022-12-29 MED ORDER — SODIUM CHLORIDE 0.9 % IV SOLN
12.5000 mg | Freq: Once | INTRAVENOUS | Status: AC
  Administered 2022-12-29: 12.5 mg via INTRAVENOUS
  Filled 2022-12-29: qty 12.5

## 2022-12-29 MED ORDER — LACTATED RINGERS IV BOLUS
1000.0000 mL | Freq: Once | INTRAVENOUS | Status: AC
  Administered 2022-12-29: 1000 mL via INTRAVENOUS

## 2022-12-29 MED ORDER — POTASSIUM CHLORIDE 10 MEQ/100ML IV SOLN
10.0000 meq | INTRAVENOUS | Status: AC
  Administered 2022-12-29 (×2): 10 meq via INTRAVENOUS
  Filled 2022-12-29 (×2): qty 100

## 2022-12-29 NOTE — ED Provider Notes (Signed)
[facility] Provider Note    Event Date/Time   First MD Initiated Contact with Patient 12/29/22 1525     (approximate)   History   Chief Complaint Abdominal Pain   HPI  Kayla Haynes is a 67 y.o. female with past medical history of hypertension, hyperlipidemia, cholecystectomy, and kidney stones presents to the ED complain of abdominal pain.  Patient ports that she has had 2 days of worsening pain in her epigastrium associated with nausea and multiple episodes of vomiting with diarrhea.  Pain is described as sharp, not exacerbated or alleviated by anything in particular.  She denies any fevers, cough, chest pain, shortness of breath.  She has not noticed any blood in her stool and denies any dysuria, hematuria, or flank pain.  She reports similar episodes of pain in the past, does admit to daily marijuana use.     Physical Exam   Triage Vital Signs: ED Triage Vitals [12/29/22 1434]  Encounter Vitals Group     BP (!) 152/111     Systolic BP Percentile      Diastolic BP Percentile      Pulse Rate 96     Resp (!) 22     Temp 97.6 F (36.4 C)     Temp Source Oral     SpO2 97 %     Weight      Height      Head Circumference      Peak Flow      Pain Score 8     Pain Loc      Pain Education      Exclude from Growth Chart     Most recent vital signs: Vitals:   12/29/22 1434 12/29/22 1856  BP: (!) 152/111 (!) 142/90  Pulse: 96 89  Resp: (!) 22 18  Temp: 97.6 F (36.4 C) 98 F (36.7 C)  SpO2: 97% 99%    Constitutional: Alert and oriented. Eyes: Conjunctivae are normal. Head: Atraumatic. Nose: No congestion/rhinnorhea. Mouth/Throat: Mucous membranes are dry. Cardiovascular: Normal rate, regular rhythm. Grossly normal heart sounds.  2+ radial pulses bilaterally. Respiratory: Normal respiratory effort.  No retractions. Lungs CTAB. Gastrointestinal: Soft and nontender. No distention. Musculoskeletal: No lower extremity tenderness nor edema.   Neurologic:  Normal speech and language. No gross focal neurologic deficits are appreciated.    ED Results / Procedures / Treatments   Labs (all labs ordered are listed, but only abnormal results are displayed) Labs Reviewed  COMPREHENSIVE METABOLIC PANEL - Abnormal; Notable for the following components:      Result Value   Sodium 129 (*)    Potassium 2.6 (*)    Chloride 97 (*)    CO2 17 (*)    Glucose, Bld 252 (*)    Creatinine, Ser 1.28 (*)    Total Protein 8.7 (*)    Total Bilirubin 1.4 (*)    GFR, Estimated 47 (*)    All other components within normal limits  CBC - Abnormal; Notable for the following components:   WBC 10.8 (*)    Hemoglobin 11.5 (*)    MCV 74.4 (*)    MCH 23.5 (*)    Platelets 445 (*)    All other components within normal limits  URINALYSIS, ROUTINE W REFLEX MICROSCOPIC - Abnormal; Notable for the following components:   Color, Urine YELLOW (*)    APPearance CLOUDY (*)    Ketones, ur 5 (*)    Protein, ur 30 (*)  Bacteria, UA MANY (*)    All other components within normal limits  LIPASE, BLOOD  MAGNESIUM     EKG  ED ECG REPORT I, Chesley Noon, the attending physician, personally viewed and interpreted this ECG.   Date: 12/29/2022  EKG Time: 17:25  Rate: 80  Rhythm: normal sinus rhythm  Axis: RAD  Intervals: Prolonged QT  ST&T Change: None  PROCEDURES:  Critical Care performed: No  Procedures   MEDICATIONS ORDERED IN ED: Medications  ondansetron (ZOFRAN-ODT) disintegrating tablet 4 mg (4 mg Oral Given 12/29/22 1440)  droperidol (INAPSINE) 2.5 MG/ML injection 2.5 mg (2.5 mg Intravenous Given 12/29/22 1614)  lactated ringers bolus 1,000 mL (0 mLs Intravenous Stopped 12/29/22 1828)  potassium chloride 10 mEq in 100 mL IVPB (0 mEq Intravenous Stopped 12/29/22 1828)  potassium chloride SA (KLOR-CON M) CR tablet 40 mEq (40 mEq Oral Given 12/29/22 1828)  promethazine (PHENERGAN) 12.5 mg in sodium chloride 0.9 % 50 mL IVPB (0 mg  Intravenous Stopped 12/29/22 1802)     IMPRESSION / MDM / ASSESSMENT AND PLAN / ED COURSE  I reviewed the triage vital signs and the nursing notes.                              67 y.o. female with past medical history of hypertension, hyperlipidemia, cholecystectomy, and kidney stones who presents to the ED with 2 days of increasing pain in her epigastrium associated with nausea, vomiting, and diarrhea.  Patient's presentation is most consistent with acute presentation with potential threat to life or bodily function.  Differential diagnosis includes, but is not limited to, gastroenteritis, gastritis, pancreatitis, hepatitis, cannabinoid hyperemesis, dehydration, electrolyte abnormality, AKI.  Patient uncomfortable but nontoxic-appearing and in no acute distress, vital signs remarkable for elevated blood pressure but otherwise reassuring.  Patient's abdomen is soft with no focal tenderness, symptoms seem most consistent with gastroenteritis versus cannabinoid hyperemesis.  We will treat with IV droperidol and hydrate with IV fluids.  Labs show no significant anemia or leukocytosis, patient does have significant hypokalemia which we will replete, add on magnesium level.  She has a mild AKI, but LFTs and lipase are unremarkable.  Do not feel imaging indicated at this time, will reassess following symptomatic treatment.  Patient continued to feel nauseous with vomiting following IV droperidol, EKG noted to have mild prolongation of QT.  She did feel much better following IV Phenergan, now tolerating oral intake without difficulty and requesting to be discharged home.  Patient offered admission but prefers to be discharged home, was counseled to follow-up with PCP for recheck of potassium levels.  She was counseled to return to the ED for new or worsening symptoms, patient agrees to plan.      FINAL CLINICAL IMPRESSION(S) / ED DIAGNOSES   Final diagnoses:  Gastroenteritis  Hypokalemia     Rx  / DC Orders   ED Discharge Orders          Ordered    promethazine (PHENERGAN) 12.5 MG tablet  Every 6 hours PRN        12/29/22 1926             Note:  This document was prepared using Dragon voice recognition software and may include unintentional dictation errors.   Chesley Noon, MD 12/29/22 [phone]

## 2022-12-29 NOTE — ED Notes (Signed)
Pt ambulatory to restroom

## 2022-12-29 NOTE — ED Notes (Signed)
Daughter at the bedside overspeaking the patient during assessment questions.

## 2022-12-29 NOTE — ED Triage Notes (Addendum)
Pt c/o generalized abd pain with nausea diarrhea since Monday. Pt endorses daily marijuana use with last use today

## 2022-12-29 NOTE — ED Notes (Signed)
ED Provider at bedside. 

## 2022-12-29 NOTE — ED Notes (Signed)
Patient very restless, up and down in bed and repeating "I need a shot of phenergan".

## 2023-01-08 ENCOUNTER — Emergency Department (HOSPITAL_BASED_OUTPATIENT_CLINIC_OR_DEPARTMENT_OTHER)
Admission: EM | Admit: 2023-01-08 | Discharge: 2023-01-08 | Disposition: A | Discharge status: Home / Self Care | Payer: No Typology Code available for payment source | Attending: Emergency Medicine | Admitting: Emergency Medicine

## 2023-01-08 ENCOUNTER — Encounter (HOSPITAL_BASED_OUTPATIENT_CLINIC_OR_DEPARTMENT_OTHER): Payer: Self-pay

## 2023-01-08 ENCOUNTER — Other Ambulatory Visit: Payer: Self-pay

## 2023-01-08 ENCOUNTER — Emergency Department (HOSPITAL_BASED_OUTPATIENT_CLINIC_OR_DEPARTMENT_OTHER): Payer: No Typology Code available for payment source

## 2023-01-08 DIAGNOSIS — R202 Paresthesia of skin: Secondary | ICD-10-CM

## 2023-01-08 DIAGNOSIS — R197 Diarrhea, unspecified: Secondary | ICD-10-CM | POA: Diagnosis not present

## 2023-01-08 DIAGNOSIS — Z20822 Contact with and (suspected) exposure to covid-19: Secondary | ICD-10-CM | POA: Insufficient documentation

## 2023-01-08 DIAGNOSIS — R1110 Vomiting, unspecified: Secondary | ICD-10-CM | POA: Diagnosis not present

## 2023-01-08 DIAGNOSIS — R531 Weakness: Secondary | ICD-10-CM | POA: Diagnosis present

## 2023-01-08 DIAGNOSIS — D649 Anemia, unspecified: Secondary | ICD-10-CM

## 2023-01-08 DIAGNOSIS — I10 Essential (primary) hypertension: Secondary | ICD-10-CM | POA: Diagnosis not present

## 2023-01-08 LAB — URINALYSIS, ROUTINE W REFLEX MICROSCOPIC
Bilirubin Urine: NEGATIVE
Glucose, UA: NEGATIVE mg/dL
Hgb urine dipstick: NEGATIVE
Ketones, ur: NEGATIVE mg/dL
Leukocytes,Ua: NEGATIVE
Nitrite: NEGATIVE
Protein, ur: NEGATIVE mg/dL
Specific Gravity, Urine: 1.005 (ref 1.005–1.030)
pH: 6 (ref 5.0–8.0)

## 2023-01-08 LAB — COMPREHENSIVE METABOLIC PANEL
ALT: 17 U/L (ref 0–44)
AST: 24 U/L (ref 15–41)
Albumin: 3.8 g/dL (ref 3.5–5.0)
Alkaline Phosphatase: 75 U/L (ref 38–126)
Anion gap: 10 (ref 5–15)
BUN: 18 mg/dL (ref 8–23)
CO2: 29 mmol/L (ref 22–32)
Calcium: 9.7 mg/dL (ref 8.9–10.3)
Chloride: 91 mmol/L — ABNORMAL LOW (ref 98–111)
Creatinine, Ser: 1.05 mg/dL — ABNORMAL HIGH (ref 0.44–1.00)
GFR, Estimated: 59 mL/min — ABNORMAL LOW (ref >60)
Glucose, Bld: 111 mg/dL — ABNORMAL HIGH (ref 70–99)
Potassium: 4.3 mmol/L (ref 3.5–5.1)
Sodium: 130 mmol/L — ABNORMAL LOW (ref 135–145)
Total Bilirubin: 0.7 mg/dL (ref 0.3–1.2)
Total Protein: 6.8 g/dL (ref 6.5–8.1)

## 2023-01-08 LAB — CBC WITH DIFFERENTIAL/PLATELET
Abs Immature Granulocytes: 0.05 10*3/uL (ref 0.00–0.07)
Basophils Absolute: 0 10*3/uL (ref 0.0–0.1)
Basophils Relative: 0 %
Eosinophils Absolute: 0 10*3/uL (ref 0.0–0.5)
Eosinophils Relative: 0 %
HCT: 31.2 % — ABNORMAL LOW (ref 36.0–46.0)
Hemoglobin: 10.1 g/dL — ABNORMAL LOW (ref 12.0–15.0)
Immature Granulocytes: 0 %
Lymphocytes Relative: 29 %
Lymphs Abs: 4 10*3/uL (ref 0.7–4.0)
MCH: 23.4 pg — ABNORMAL LOW (ref 26.0–34.0)
MCHC: 32.4 g/dL (ref 30.0–36.0)
MCV: 72.4 fL — ABNORMAL LOW (ref 80.0–100.0)
Monocytes Absolute: 1.4 10*3/uL — ABNORMAL HIGH (ref 0.1–1.0)
Monocytes Relative: 10 %
Neutro Abs: 8.2 10*3/uL — ABNORMAL HIGH (ref 1.7–7.7)
Neutrophils Relative %: 61 %
Platelets: 372 10*3/uL (ref 150–400)
RBC: 4.31 MIL/uL (ref 3.87–5.11)
RDW: 14 % (ref 11.5–15.5)
WBC: 13.8 10*3/uL — ABNORMAL HIGH (ref 4.0–10.5)
nRBC: 0 % (ref 0.0–0.2)

## 2023-01-08 LAB — RESP PANEL BY RT-PCR (RSV, FLU A&B, COVID)  RVPGX2
Influenza A by PCR: NEGATIVE
Influenza B by PCR: NEGATIVE
Resp Syncytial Virus by PCR: NEGATIVE
SARS Coronavirus 2 by RT PCR: NEGATIVE

## 2023-01-08 LAB — MAGNESIUM: Magnesium: 1.8 mg/dL (ref 1.7–2.4)

## 2023-01-08 LAB — TROPONIN I (HIGH SENSITIVITY): Troponin I (High Sensitivity): 15 ng/L (ref <18)

## 2023-01-08 MED ORDER — SODIUM CHLORIDE 0.9 % IV BOLUS: 1000.0000 mL | Freq: Once | INTRAVENOUS | Status: DC

## 2023-01-08 NOTE — ED Notes (Signed)
Pt to CT via w/c with tech.

## 2023-01-08 NOTE — ED Notes (Signed)
Pt discharged in stable condition. Pt expressed understanding to follow up with pcp and to return to ER for any further concern on complications. Pt ambulated to w/c with even steady gait, no apparent distress. Pt brought out to front of ER to await her Lyft, all questions answered, no other needs at this time.

## 2023-01-08 NOTE — ED Provider Notes (Signed)
[facility] EMERGENCY DEPARTMENT AT [facility] Provider Note   CSN: [id number] Arrival date & time: 01/08/23  1404     History  No chief complaint on file.   Kayla Haynes is a 67 y.o. female.  Patient with history of hypertension, high cholesterol --presents to the emergency department today for multiple complaints.  Patient has had a couple of weeks of vomiting and diarrhea.  Largely the symptoms have improved however she is still having watery stools without blood during the day.  She was seen at [place] approximately 10 days ago and treated for hypokalemia.  [facility]  She continues to feel generally weak.  She had a syncopal episode in the bathroom 5 days ago and hit her head.  She has been drinking Gatorade to try to maintain fluid intake.  She also reports "poor circulation" in her feet and states that the third and fourth toes on the left foot have been tingling. Patient denies signs of stroke including: facial droop, slurred speech, aphasia, weakness/numbness in extremities, imbalance/trouble walking.  She has not had any chest pain or shortness of breath.  No respiratory symptoms.  No urinary symptoms.  She presented to the emergency department today because a family member was concerned about her ongoing symptoms.  She has not had any new or acute events in the past 24 hours.       Home Medications Prior to Admission medications   Medication Sig Start Date End Date Taking? Authorizing Provider  amLODipine (NORVASC) 5 MG tablet Take 5 mg by mouth daily. 08/31/22   [provider]  atorvastatin (LIPITOR) 80 MG tablet Take 80 mg by mouth daily. 08/31/22   [provider]  cephALEXin (KEFLEX) 500 MG capsule Take 1 capsule (500 mg total) by mouth 2 (two) times daily. Patient not taking: Reported on 12/29/2022 09/07/22   Sharyn Creamer, MD  Cholecalciferol 50 MCG (2000 UT) TABS Take 1 tablet by mouth daily. 08/31/22    [provider]  dimenhyDRINATE (DRAMAMINE) 50 MG tablet Take 50 mg by mouth every 8 (eight) hours as needed for nausea.    [provider]  HYDROcodone-acetaminophen (NORCO/VICODIN) 5-325 MG tablet Take 1-2 tablets by mouth every 6 (six) hours as needed for moderate pain. Patient not taking: Reported on 12/29/2022 09/16/22   Vanna Scotland, MD  ibuprofen (ADVIL) 200 MG tablet Take 200 mg by mouth every 6 (six) hours as needed for mild pain.    [provider]  irbesartan (AVAPRO) 300 MG tablet Take 300 mg by mouth daily. 08/31/22   [provider]  ondansetron (ZOFRAN-ODT) 4 MG disintegrating tablet Take 1 tablet (4 mg total) by mouth every 6 (six) hours as needed for nausea or vomiting. 10/13/22   Sharyn Creamer, MD  promethazine (PHENERGAN) 12.5 MG tablet Take 1 tablet (12.5 mg total) by mouth every 6 (six) hours as needed for nausea or vomiting. 12/29/22   Chesley Noon, MD  tamsulosin (FLOMAX) 0.4 MG CAPS capsule Take 1 capsule (0.4 mg total) by mouth daily. Patient not taking: Reported on 12/29/2022 09/16/22   Vanna Scotland, MD      Allergies    Patient has no known allergies.    Review of Systems   Review of Systems  Physical Exam Updated Vital Signs BP (!) 102/47 (BP Location: Right Arm)   Pulse 60   Temp 98 F (36.7 C)   Resp 18   Ht 5\' 3"  (1.6 m)  Wt 59.9 kg   SpO2 100%   BMI 23.38 kg/m  Physical Exam Vitals and nursing note reviewed.  Constitutional:      General: She is not in acute distress.    Appearance: She is well-developed.  HENT:     Head: Normocephalic.     Comments: Patient with a couple small scabs on her mid forehead region from fall several days ago.  No wound infections noted.    Right Ear: External ear normal.     Left Ear: External ear normal.     Nose: Nose normal.     Mouth/Throat:     Pharynx: Uvula midline.  Eyes:     General: Lids are normal.     Extraocular Movements:     Right eye: No nystagmus.     Left  eye: No nystagmus.     Conjunctiva/sclera: Conjunctivae normal.     Pupils: Pupils are equal, round, and reactive to light.  Cardiovascular:     Rate and Rhythm: Normal rate and regular rhythm.     Pulses:          Dorsalis pedis pulses are 2+ on the right side and 2+ on the left side.     Heart sounds: No murmur heard. Pulmonary:     Effort: Pulmonary effort is normal. No respiratory distress.     Breath sounds: Normal breath sounds. No wheezing, rhonchi or rales.  Abdominal:     Palpations: Abdomen is soft.     Tenderness: There is no abdominal tenderness. There is no guarding or rebound.  Musculoskeletal:     Cervical back: Normal range of motion and neck supple. No tenderness or bony tenderness.     Right lower leg: No edema.     Left lower leg: No edema.     Comments: Left foot: Toes appear well-perfused with capillary refill less than 2 seconds in all toes.  She reports tingling sensation when I touch her third and fourth toes, however these appear normal.  She wiggles her toes and ranges her feet and ankles without any difficulty or disability.  Skin:    General: Skin is warm and dry.     Findings: No rash.  Neurological:     General: No focal deficit present.     Mental Status: She is alert and oriented to person, place, and time. Mental status is at baseline.     GCS: GCS eye subscore is 4. GCS verbal subscore is 5. GCS motor subscore is 6.     Cranial Nerves: No cranial nerve deficit.     Sensory: No sensory deficit.     Motor: No weakness.     Coordination: Coordination normal.     Gait: Gait normal.     Comments: Upper extremity myotomes tested bilaterally:  C5 Shoulder abduction 5/5 C6 Elbow flexion/wrist extension 5/5 C7 Elbow extension 5/5 C8 Finger flexion 5/5 T1 Finger abduction 5/5  Lower extremity myotomes tested bilaterally: L2 Hip flexion 5/5 L3 Knee extension 5/5 L4 Ankle dorsiflexion 5/5 S1 Ankle plantar flexion 5/5   Psychiatric:        Mood and  Affect: Mood normal.     ED Results / Procedures / Treatments   Labs (all labs ordered are listed, but only abnormal results are displayed) Labs Reviewed - No data to display  EKG None  Radiology No results found.  Procedures Procedures    Medications Ordered in ED Medications  sodium chloride 0.9 % bolus 1,000 mL (has no  administration in time range)    ED Course/ Medical Decision Making/ A&P    Patient seen and examined. History obtained directly from patient and family member at bedside.  I reviewed previous ED visit from .  Potassium was 2.6 at that time.  Given generalized weakness and other symptoms, will recheck electrolytes and kidney function.  Given her recent episode of syncope and fall, will obtain head CT and cardiac workup.  Overall she looks well.  No focal neurologic deficits to suggest stroke other than the paresthesias in her toes on the left foot.  She does not have any demonstratable weakness.  No severe headache.  Labs/EKG: Ordered CBC, CMP, magnesium, troponin, UA, EKG.  Imaging: Ordered CT head.  Medications/Fluids: Ordered: IV fluid bolus.   Most recent vital signs reviewed and are as follows: BP (!) 102/47 (BP Location: Right Arm)   Pulse 60   Temp 98 F (36.7 C)   Resp 18   Ht 5\' 3"  (1.6 m)   Wt 59.9 kg   SpO2 100%   BMI 23.38 kg/m   Initial impression: Generalized weakness, diarrhea, hypokalemia diagnosed recently.  Low concern for acute stroke.  5:01 PM Reassessment performed. Patient appears stable.  Staff had difficulty placing IV for fluids, so these were canceled after reassuring orthostatics.  Discussed results with patient and daughter by telephone.  Labs personally reviewed and interpreted including: CBC with minimally elevated white blood cell count at 13.8, hemoglobin slightly lower than her recent baseline at 10.1 today; CMP mild hyponatremia which appears chronic at 130, improved potassium at 4.3, glucose 111, normal  transaminases; magnesium was normal; troponin was normal at 15; negative respiratory panel; negative urine.  Imaging personally visualized and interpreted including: CT scan of the head, agree no acute findings.  Reviewed pertinent lab work and imaging with patient at bedside. Questions answered.   Most current vital signs reviewed and are as follows: BP (!) 102/54   Pulse (!) 56   Temp 98 F (36.7 C)   Resp 19   Ht 5\' 3"  (1.6 m)   Wt 59.9 kg   SpO2 100%   BMI 23.38 kg/m   Plan: Discharge to home.   Prescriptions written for: None  Other home care instructions discussed: Rest, hydration  ED return instructions discussed: Patient counseled to return if they have weakness in their arms or legs, slurred speech, trouble walking or talking, confusion, trouble with their balance, or if they have any other concerns. Patient verbalizes understanding and agrees with plan.   Follow-up instructions discussed: Patient encouraged to follow-up with their PCP in 7 days.  Discussed importance of having hemoglobin rechecked to ensure no persistent downward trend.                                  Medical Decision Making Amount and/or Complexity of Data Reviewed Labs: ordered. Radiology: ordered.   Patient here to be rechecked today.  She has been having generalized weakness.  Broad workup today shows improved potassium from recently.  Negative cardiac workup.  No acute findings on head CT.  Patient has some paresthesias in her left third and fourth toes, no true numbness.  No other focal neurologic deficits to suggest acute stroke.  I do not feel that she needs transferred for MRI at this time.  Urine is clear.  Respiratory panel negative.  Patient will continue supportive care at home and follow-up with her  PCP.  Her hemoglobin appears to be trending down slowly from 12 now to 10.1 over the past several months.  Encouraged her to have this rechecked by her primary care doctor in the next week or  so to ensure no continued downward trend.  She denies black or bloody stools.  The patient's vital signs, pertinent lab work and imaging were reviewed and interpreted as discussed in the ED course. Hospitalization was considered for further testing, treatments, or serial exams/observation. However as patient is well-appearing, has a stable exam, and reassuring studies today, I do not feel that they warrant admission at this time. This plan was discussed with the patient who verbalizes agreement and comfort with this plan and seems reliable and able to return to the Emergency Department with worsening or changing symptoms.          Final Clinical Impression(s) / ED Diagnoses Final diagnoses:  Generalized weakness  Anemia, unspecified type  Paresthesia    Rx / DC Orders ED Discharge Orders     None         Renne Crigler, PA-C 01/08/23 1703    Melene Plan, DO 01/09/23 [phone]

## 2023-01-08 NOTE — ED Notes (Signed)
Geiple, PA-C made aware pt does not have PIV, and has been stick by this RN x2 and by charge RN x1. Will obtain orthostatic VS at this time.

## 2023-01-08 NOTE — Discharge Instructions (Addendum)
Please read and follow all provided instructions.  Your diagnoses today include:  1. Generalized weakness   2. Anemia, unspecified type   3. Paresthesia     Tests performed today include: Blood cell counts and electrolytes showed slightly worse anemia today, normal white blood cells COVID, flu, RSV testing was negative Cardiac enzyme for the heart was normal, no sign of heart attack or other problems EKG did not show any abnormal heart rhythms Blood cell count and electrolytes were stable, improved potassium today CT scan of the brain did not show any acute findings such as stroke or bleeding Vital signs. See below for your results today.   Medications prescribed:  None  Take any prescribed medications only as directed.  Home care instructions:  Follow any educational materials contained in this packet.  BE VERY CAREFUL not to take multiple medicines containing Tylenol (also called acetaminophen). Doing so can lead to an overdose which can damage your liver and cause liver failure and possibly death.   Follow-up instructions: Please follow-up with your primary care provider in the next 7 days for further evaluation of your symptoms.  You should have your hemoglobin rechecked by your primary care doctor as it appears to be slowly trending down over the past several months.  Return instructions:  Please return to the Emergency Department if you experience worsening symptoms.  Return if you have weakness in your arms or legs, slurred speech, trouble walking or talking, confusion, or trouble with your balance.  Please return if you have any other emergent concerns.  Additional Information:  Your vital signs today were: BP (!) 102/47 (BP Location: Right Arm)   Pulse 60   Temp 98 F (36.7 C)   Resp 18   Ht 5\' 3"  (1.6 m)   Wt 59.9 kg   SpO2 100%   BMI 23.38 kg/m  If your blood pressure (BP) was elevated above 135/85 this visit, please have this repeated by your doctor within  one month. --------------

## 2023-01-08 NOTE — ED Notes (Signed)
Pt made aware of need for urine sample, unable to provide one at this time

## 2023-01-08 NOTE — ED Triage Notes (Signed)
In for eval of dizziness, syncopal episode Monday with fall and hit head on something in shower, and tingling in feet. 10 days ago she was seen at [facility] with N/V/D. She was given KCL po for Hypokalemia and  with gastritis. Then she was seen at [facility] for same. Having diarrhea stools and nausea. Denies vomiting.

## 2023-04-07 ENCOUNTER — Emergency Department (HOSPITAL_BASED_OUTPATIENT_CLINIC_OR_DEPARTMENT_OTHER)
Admission: EM | Admit: 2023-04-07 | Discharge: 2023-04-08 | Disposition: A | Discharge status: Home / Self Care | Payer: No Typology Code available for payment source | Attending: Emergency Medicine | Admitting: Emergency Medicine

## 2023-04-07 ENCOUNTER — Other Ambulatory Visit: Payer: Self-pay

## 2023-04-07 ENCOUNTER — Encounter (HOSPITAL_BASED_OUTPATIENT_CLINIC_OR_DEPARTMENT_OTHER): Payer: Self-pay

## 2023-04-07 DIAGNOSIS — D649 Anemia, unspecified: Secondary | ICD-10-CM | POA: Insufficient documentation

## 2023-04-07 DIAGNOSIS — R1013 Epigastric pain: Secondary | ICD-10-CM | POA: Insufficient documentation

## 2023-04-07 DIAGNOSIS — R739 Hyperglycemia, unspecified: Secondary | ICD-10-CM

## 2023-04-07 DIAGNOSIS — E871 Hypo-osmolality and hyponatremia: Secondary | ICD-10-CM | POA: Insufficient documentation

## 2023-04-07 DIAGNOSIS — E876 Hypokalemia: Secondary | ICD-10-CM | POA: Insufficient documentation

## 2023-04-07 DIAGNOSIS — Z79899 Other long term (current) drug therapy: Secondary | ICD-10-CM | POA: Diagnosis not present

## 2023-04-07 DIAGNOSIS — R7309 Other abnormal glucose: Secondary | ICD-10-CM | POA: Insufficient documentation

## 2023-04-07 DIAGNOSIS — I10 Essential (primary) hypertension: Secondary | ICD-10-CM | POA: Diagnosis not present

## 2023-04-07 DIAGNOSIS — R17 Unspecified jaundice: Secondary | ICD-10-CM

## 2023-04-07 LAB — COMPREHENSIVE METABOLIC PANEL
ALT: 22 U/L (ref 0–44)
AST: 45 U/L — ABNORMAL HIGH (ref 15–41)
Albumin: 4.6 g/dL (ref 3.5–5.0)
Alkaline Phosphatase: 78 U/L (ref 38–126)
Anion gap: 17 — ABNORMAL HIGH (ref 5–15)
BUN: 20 mg/dL (ref 8–23)
CO2: 20 mmol/L — ABNORMAL LOW (ref 22–32)
Calcium: 10 mg/dL (ref 8.9–10.3)
Chloride: 92 mmol/L — ABNORMAL LOW (ref 98–111)
Creatinine, Ser: 0.87 mg/dL (ref 0.44–1.00)
GFR, Estimated: >60 mL/min (ref >60)
Glucose, Bld: 165 mg/dL — ABNORMAL HIGH (ref 70–99)
Potassium: 2.6 mmol/L — CL (ref 3.5–5.1)
Sodium: 129 mmol/L — ABNORMAL LOW (ref 135–145)
Total Bilirubin: 1.3 mg/dL — ABNORMAL HIGH (ref <1.2)
Total Protein: 8 g/dL (ref 6.5–8.1)

## 2023-04-07 LAB — URINALYSIS, ROUTINE W REFLEX MICROSCOPIC
Bilirubin Urine: NEGATIVE
Glucose, UA: NEGATIVE mg/dL
Ketones, ur: 40 mg/dL — AB
Leukocytes,Ua: NEGATIVE
Nitrite: NEGATIVE
Protein, ur: 30 mg/dL — AB
Specific Gravity, Urine: 1.02 (ref 1.005–1.030)
pH: 5.5 (ref 5.0–8.0)

## 2023-04-07 LAB — CBC
HCT: 34.9 % — ABNORMAL LOW (ref 36.0–46.0)
Hemoglobin: 11.1 g/dL — ABNORMAL LOW (ref 12.0–15.0)
MCH: 23.7 pg — ABNORMAL LOW (ref 26.0–34.0)
MCHC: 31.8 g/dL (ref 30.0–36.0)
MCV: 74.4 fL — ABNORMAL LOW (ref 80.0–100.0)
Platelets: 306 10*3/uL (ref 150–400)
RBC: 4.69 MIL/uL (ref 3.87–5.11)
RDW: 13.6 % (ref 11.5–15.5)
WBC: 11 10*3/uL — ABNORMAL HIGH (ref 4.0–10.5)
nRBC: 0 % (ref 0.0–0.2)

## 2023-04-07 LAB — URINALYSIS, MICROSCOPIC (REFLEX)

## 2023-04-07 LAB — MAGNESIUM: Magnesium: 1.9 mg/dL (ref 1.7–2.4)

## 2023-04-07 LAB — LIPASE, BLOOD: Lipase: 34 U/L (ref 11–51)

## 2023-04-07 MED ORDER — PANTOPRAZOLE SODIUM 40 MG IV SOLR
40.0000 mg | Freq: Once | INTRAVENOUS | Status: AC
  Administered 2023-04-07: 40 mg via INTRAVENOUS
  Filled 2023-04-07: qty 10

## 2023-04-07 MED ORDER — POTASSIUM CHLORIDE 10 MEQ/100ML IV SOLN
10.0000 meq | INTRAVENOUS | Status: AC
  Administered 2023-04-08 (×3): 10 meq via INTRAVENOUS
  Filled 2023-04-07 (×3): qty 100

## 2023-04-07 MED ORDER — ONDANSETRON HCL 4 MG/2ML IJ SOLN
4.0000 mg | Freq: Once | INTRAMUSCULAR | Status: AC
  Administered 2023-04-07: 4 mg via INTRAVENOUS
  Filled 2023-04-07: qty 2

## 2023-04-07 MED ORDER — POTASSIUM CHLORIDE CRYS ER 20 MEQ PO TBCR
40.0000 meq | EXTENDED_RELEASE_TABLET | Freq: Once | ORAL | Status: AC
  Administered 2023-04-07: 40 meq via ORAL
  Filled 2023-04-07: qty 2

## 2023-04-07 MED ORDER — MAGNESIUM SULFATE 2 GM/50ML IV SOLN
2.0000 g | Freq: Once | INTRAVENOUS | Status: AC
  Administered 2023-04-08: 2 g via INTRAVENOUS
  Filled 2023-04-07: qty 50

## 2023-04-07 NOTE — ED Provider Notes (Signed)
[place] [facility] AT [facility] Provider Note   CSN: [id number] Arrival date & time: 04/07/23  2046     History  Chief Complaint  Patient presents with   Abdominal Pain    Kayla Haynes is a 67 y.o. female.  The history is provided by the patient.  Abdominal Pain She has history of hypertension, hyperlipidemia, GERD and comes in with epigastric pain for the last 5 days.  There are some radiation to the chest, none to the back.  There is associated nausea but no vomiting.  She denies constipation or diarrhea.  It feels better when she takes a hot shower, she has not taken any medication for.  Pain is not affected by eating, but she states that her food does not seem to be digesting.   Home Medications Prior to Admission medications   Medication Sig Start Date End Date Taking? Authorizing Provider  amLODipine (NORVASC) 5 MG tablet Take 5 mg by mouth daily. 08/31/22   [provider]  atorvastatin (LIPITOR) 80 MG tablet Take 80 mg by mouth daily. 08/31/22   [provider]  cephALEXin (KEFLEX) 500 MG capsule Take 1 capsule (500 mg total) by mouth 2 (two) times daily. Patient not taking: Reported on 12/29/2022 09/07/22   Sharyn Creamer, MD  Cholecalciferol 50 MCG (2000 UT) TABS Take 1 tablet by mouth daily. 08/31/22   [provider]  dimenhyDRINATE (DRAMAMINE) 50 MG tablet Take 50 mg by mouth every 8 (eight) hours as needed for nausea.    [provider]  HYDROcodone-acetaminophen (NORCO/VICODIN) 5-325 MG tablet Take 1-2 tablets by mouth every 6 (six) hours as needed for moderate pain. Patient not taking: Reported on 12/29/2022 09/16/22   Vanna Scotland, MD  ibuprofen (ADVIL) 200 MG tablet Take 200 mg by mouth every 6 (six) hours as needed for mild pain.    [provider]  irbesartan (AVAPRO) 300 MG tablet Take 300 mg by mouth daily. 08/31/22   [provider]  ondansetron (ZOFRAN-ODT) 4 MG disintegrating tablet  Take 1 tablet (4 mg total) by mouth every 6 (six) hours as needed for nausea or vomiting. 10/13/22   Sharyn Creamer, MD  promethazine (PHENERGAN) 12.5 MG tablet Take 1 tablet (12.5 mg total) by mouth every 6 (six) hours as needed for nausea or vomiting. 12/29/22   Chesley Noon, MD  tamsulosin (FLOMAX) 0.4 MG CAPS capsule Take 1 capsule (0.4 mg total) by mouth daily. Patient not taking: Reported on 12/29/2022 09/16/22   Vanna Scotland, MD      Allergies    Patient has no known allergies.    Review of Systems   Review of Systems  Gastrointestinal:  Positive for abdominal pain.  All other systems reviewed and are negative.   Physical Exam Updated Vital Signs BP (!) 166/114 (BP Location: Right Arm)   Pulse (!) 51   Temp (!) 95.3 F (35.2 C)   Resp 16   Ht 5\' 3"  (1.6 m)   Wt 59 kg   SpO2 99%   BMI 23.03 kg/m  Physical Exam Vitals and nursing note reviewed.   67 year old female, resting comfortably and in no acute distress. Vital signs are significant for elevated blood pressure and slightly slow heart rate. Oxygen saturation is 99%, which is normal. Head is normocephalic and atraumatic. PERRLA, EOMI. Oropharynx is clear. Neck is nontender and supple without adenopathy or JVD. Back is nontender and there is no CVA tenderness. Lungs are clear without rales, wheezes,  or rhonchi. Chest is nontender. Heart has regular rate and rhythm without murmur. Abdomen is soft, flat, with mild to moderate epigastric tenderness.  There is no rebound or guarding. Extremities have no cyanosis or edema, full range of motion is present. Skin is warm and dry without rash. Neurologic: Mental status is normal, moves all extremities equally.  ED Results / Procedures / Treatments   Labs (all labs ordered are listed, but only abnormal results are displayed) Labs Reviewed  COMPREHENSIVE METABOLIC PANEL - Abnormal; Notable for the following components:      Result Value   Sodium 129 (*)    Potassium 2.6 (*)     Chloride 92 (*)    CO2 20 (*)    Glucose, Bld 165 (*)    AST 45 (*)    Total Bilirubin 1.3 (*)    Anion gap 17 (*)    All other components within normal limits  CBC - Abnormal; Notable for the following components:   WBC 11.0 (*)    Hemoglobin 11.1 (*)    HCT 34.9 (*)    MCV 74.4 (*)    MCH 23.7 (*)    All other components within normal limits  URINALYSIS, ROUTINE W REFLEX MICROSCOPIC - Abnormal; Notable for the following components:   APPearance HAZY (*)    Hgb urine dipstick TRACE (*)    Ketones, ur 40 (*)    Protein, ur 30 (*)    All other components within normal limits  URINALYSIS, MICROSCOPIC (REFLEX) - Abnormal; Notable for the following components:   Bacteria, UA FEW (*)    All other components within normal limits  LIPASE, BLOOD  MAGNESIUM    EKG EKG Interpretation Date/Time:  Thursday April 07 2023 21:52:11 EST Ventricular Rate:  74 PR Interval:  164 QRS Duration:  108 QT Interval:  458 QTC Calculation: 509 R Axis:   87  Text Interpretation: Sinus rhythm Right atrial enlargement Borderline right axis deviation Abnormal T, consider ischemia, diffuse leads Prolonged QT interval Baseline wander in lead(s) II When compared to 01/08/23 QT is prolonged. T-wave inversions appear to be unchanged but is limited due to baseline wander Confirmed by Vonita Moss [phone]) on 04/07/2023 11:01:27 PM  Radiology No results found.  Procedures Procedures  Cardiac monitor shows normal sinus rhythm, per my interpretation.  Medications Ordered in ED Medications  pantoprazole (PROTONIX) injection 40 mg (40 mg Intravenous Given 04/07/23 2358)  ondansetron (ZOFRAN) injection 4 mg (4 mg Intravenous Given 04/07/23 2359)  potassium chloride SA (KLOR-CON M) CR tablet 40 mEq (40 mEq Oral Given 04/07/23 2345)  potassium chloride 10 mEq in 100 mL IVPB (0 mEq Intravenous Stopped 04/08/23 0543)  magnesium sulfate IVPB 2 g 50 mL (0 g Intravenous Stopped 04/08/23 0122)  iohexol  (OMNIPAQUE) 300 MG/ML solution 80 mL (80 mLs Intravenous Contrast Given 04/08/23 0018)  alum & mag hydroxide-simeth (MAALOX/MYLANTA) 200-200-20 MG/5ML suspension 30 mL (30 mLs Oral Given 04/08/23 0312)  morphine (PF) 4 MG/ML injection 4 mg (4 mg Intravenous Given 04/08/23 0326)    ED Course/ Medical Decision Making/ A&P                                 Medical Decision Making Amount and/or Complexity of Data Reviewed Labs: ordered. Radiology: ordered.  Risk OTC drugs. Prescription drug management.   Epigastric pain.  Differential diagnosis includes, but is not limited to, peptic ulcer disease, GERD, pancreatitis, cholecystitis, diverticulitis.  I have reviewed her electrocardiogram, and my interpretation is nonspecific T wave changes unchanged from prior.  I have reviewed her laboratory tests, and my interpretation is hyponatremia which appears to be chronic, moderately severe hypokalemia, elevated random glucose, borderline elevated AST which is probably not clinically significant, borderline elevated bilirubin which had been present in the past, mild leukocytosis which is nonspecific, stable anemia, urinalysis significant for ketones and protein but no evidence of UTI.  I have reviewed her past records, and CT renal stone study on 10/13/2022 did show presence of diverticulosis, no obvious cholelithiasis.  I have ordered CT of abdomen and pelvis.  I ordered a dose of pantoprazole.  Have ordered oral and intravenous potassium.  Magnesium was in the normal range but borderline low, I ordered intravenous magnesium.  Patient is doing better following pantoprazole although she did have some momentary increased pain and I ordered a single dose of morphine for her.  CT scan shows diverticulosis and prior cholecystectomy and hysterectomy but no acute process.  I have independently viewed the images, and agree with the radiologist's interpretation.  I am discharging patient with prescription for  pantoprazole, oral potassium and a small number of oxycodone tablets.  I am referring her to gastroenterology for further outpatient workup, referring her back to her primary care provider to monitor her potassium.  Return precautions discussed.  Final Clinical Impression(s) / ED Diagnoses Final diagnoses:  Epigastric pain  Hypokalemia  Hyponatremia  Normochromic normocytic anemia  Serum total bilirubin elevated  Elevated random blood glucose level    Rx / DC Orders ED Discharge Orders          Ordered    pantoprazole (PROTONIX) 40 MG tablet  Daily        04/08/23 0509    oxyCODONE (ROXICODONE) 5 MG immediate release tablet  Every 4 hours PRN        04/08/23 0509    potassium chloride SA (KLOR-CON M) 20 MEQ tablet  2 times daily        04/08/23 0511              Dione Booze, MD 04/08/23 [id number]

## 2023-04-07 NOTE — ED Triage Notes (Signed)
Patient here POV from Home.  Endorses Epigastric ABD Pain that began 5 days ago. Waxes and wanes in intensity.   Some Nausea. No Emesis. Mild Diarrhea. No Known Fevers.   Uncomfortable during Triage. A&Ox4. GCS 15. Ambulatory.

## 2023-04-08 ENCOUNTER — Other Ambulatory Visit (HOSPITAL_BASED_OUTPATIENT_CLINIC_OR_DEPARTMENT_OTHER): Payer: No Typology Code available for payment source

## 2023-04-08 ENCOUNTER — Emergency Department (HOSPITAL_BASED_OUTPATIENT_CLINIC_OR_DEPARTMENT_OTHER): Payer: No Typology Code available for payment source

## 2023-04-08 MED ORDER — IOHEXOL 300 MG/ML  SOLN
80.0000 mL | Freq: Once | INTRAMUSCULAR | Status: AC | PRN
  Administered 2023-04-08: 80 mL via INTRAVENOUS

## 2023-04-08 MED ORDER — POTASSIUM CHLORIDE CRYS ER 20 MEQ PO TBCR: 20.0000 meq | EXTENDED_RELEASE_TABLET | Freq: Two times a day (BID) | ORAL | 0 refills | Status: DC

## 2023-04-08 MED ORDER — OXYCODONE HCL 5 MG PO TABS: 5.0000 mg | ORAL_TABLET | ORAL | 0 refills | Status: DC | PRN

## 2023-04-08 MED ORDER — ALUM & MAG HYDROXIDE-SIMETH 200-200-20 MG/5ML PO SUSP
30.0000 mL | Freq: Once | ORAL | Status: AC
  Administered 2023-04-08: 30 mL via ORAL
  Filled 2023-04-08: qty 30

## 2023-04-08 MED ORDER — PANTOPRAZOLE SODIUM 40 MG PO TBEC: 40.0000 mg | DELAYED_RELEASE_TABLET | Freq: Every day | ORAL | 0 refills | Status: DC

## 2023-04-08 MED ORDER — MORPHINE SULFATE (PF) 4 MG/ML IV SOLN
4.0000 mg | Freq: Once | INTRAVENOUS | Status: AC
  Administered 2023-04-08: 4 mg via INTRAVENOUS
  Filled 2023-04-08: qty 1

## 2023-04-08 NOTE — Discharge Instructions (Addendum)
I believe your pain is related to acid production in the stomach.  I have ordered a prescription for pantoprazole which you need to take every day.  I feel you would benefit from evaluation by a gastroenterologist, please call them for an appointment.  Return if your symptoms are not being adequately controlled at home.  Your potassium was very low.  I have ordered potassium for you to take twice a day until the prescription runs out.  This is the second time you are potassium has gotten very low.  Please follow-up with your primary care provider to recheck your potassium and monitor it as an outpatient.  If it continues to drop down, you may need to be on potassium on an ongoing basis

## 2023-04-08 NOTE — ED Notes (Signed)
RN provided AVS using Teachback Method. Patient verbalizes understanding of Discharge Instructions. Opportunity for Questioning and Answers were provided by RN. Patient Discharged from ED ambulatory to home with family friend.

## 2023-04-08 NOTE — ED Notes (Signed)
Patient transported to CT 

## 2023-07-10 ENCOUNTER — Encounter (HOSPITAL_BASED_OUTPATIENT_CLINIC_OR_DEPARTMENT_OTHER): Payer: Self-pay

## 2023-07-10 ENCOUNTER — Emergency Department (HOSPITAL_BASED_OUTPATIENT_CLINIC_OR_DEPARTMENT_OTHER)
Admission: EM | Admit: 2023-07-10 | Discharge: 2023-07-11 | Disposition: A | Discharge status: Home / Self Care | Attending: Emergency Medicine | Admitting: Emergency Medicine

## 2023-07-10 ENCOUNTER — Other Ambulatory Visit: Payer: Self-pay

## 2023-07-10 DIAGNOSIS — I10 Essential (primary) hypertension: Secondary | ICD-10-CM | POA: Diagnosis not present

## 2023-07-10 DIAGNOSIS — R197 Diarrhea, unspecified: Secondary | ICD-10-CM | POA: Insufficient documentation

## 2023-07-10 DIAGNOSIS — E876 Hypokalemia: Secondary | ICD-10-CM | POA: Diagnosis not present

## 2023-07-10 DIAGNOSIS — R109 Unspecified abdominal pain: Secondary | ICD-10-CM | POA: Diagnosis not present

## 2023-07-10 DIAGNOSIS — Z79899 Other long term (current) drug therapy: Secondary | ICD-10-CM | POA: Diagnosis not present

## 2023-07-10 DIAGNOSIS — D72829 Elevated white blood cell count, unspecified: Secondary | ICD-10-CM | POA: Insufficient documentation

## 2023-07-10 DIAGNOSIS — R1110 Vomiting, unspecified: Secondary | ICD-10-CM | POA: Diagnosis not present

## 2023-07-10 LAB — COMPREHENSIVE METABOLIC PANEL WITH GFR
ALT: 17 U/L (ref 0–44)
AST: 16 U/L (ref 15–41)
Albumin: 5.3 g/dL — ABNORMAL HIGH (ref 3.5–5.0)
Alkaline Phosphatase: 103 U/L (ref 38–126)
Anion gap: 21 — ABNORMAL HIGH (ref 5–15)
BUN: 20 mg/dL (ref 8–23)
CO2: 17 mmol/L — ABNORMAL LOW (ref 22–32)
Calcium: 11.1 mg/dL — ABNORMAL HIGH (ref 8.9–10.3)
Chloride: 99 mmol/L (ref 98–111)
Creatinine, Ser: 1.16 mg/dL — ABNORMAL HIGH (ref 0.44–1.00)
GFR, Estimated: 52 mL/min — ABNORMAL LOW (ref >60)
Glucose, Bld: 237 mg/dL — ABNORMAL HIGH (ref 70–99)
Potassium: 3.1 mmol/L — ABNORMAL LOW (ref 3.5–5.1)
Sodium: 137 mmol/L (ref 135–145)
Total Bilirubin: 1 mg/dL (ref 0.0–1.2)
Total Protein: 8.8 g/dL — ABNORMAL HIGH (ref 6.5–8.1)

## 2023-07-10 LAB — URINALYSIS, ROUTINE W REFLEX MICROSCOPIC
Bacteria, UA: NONE SEEN
Bilirubin Urine: NEGATIVE
Glucose, UA: NEGATIVE mg/dL
Hgb urine dipstick: NEGATIVE
Ketones, ur: 15 mg/dL — AB
Leukocytes,Ua: NEGATIVE
Nitrite: NEGATIVE
Protein, ur: 30 mg/dL — AB
Specific Gravity, Urine: 1.02 (ref 1.005–1.030)
pH: 5.5 (ref 5.0–8.0)

## 2023-07-10 LAB — CBC
HCT: 38 % (ref 36.0–46.0)
Hemoglobin: 12.2 g/dL (ref 12.0–15.0)
MCH: 23.9 pg — ABNORMAL LOW (ref 26.0–34.0)
MCHC: 32.1 g/dL (ref 30.0–36.0)
MCV: 74.4 fL — ABNORMAL LOW (ref 80.0–100.0)
Platelets: 410 10*3/uL — ABNORMAL HIGH (ref 150–400)
RBC: 5.11 MIL/uL (ref 3.87–5.11)
RDW: 14.1 % (ref 11.5–15.5)
WBC: 15.7 10*3/uL — ABNORMAL HIGH (ref 4.0–10.5)
nRBC: 0 % (ref 0.0–0.2)

## 2023-07-10 LAB — LIPASE, BLOOD: Lipase: 16 U/L (ref 11–51)

## 2023-07-10 LAB — MAGNESIUM: Magnesium: 1.8 mg/dL (ref 1.7–2.4)

## 2023-07-10 MED ORDER — ONDANSETRON HCL 4 MG/2ML IJ SOLN
4.0000 mg | Freq: Once | INTRAMUSCULAR | Status: AC
  Administered 2023-07-11: 4 mg via INTRAVENOUS
  Filled 2023-07-10: qty 2

## 2023-07-10 MED ORDER — MORPHINE SULFATE (PF) 4 MG/ML IV SOLN
4.0000 mg | Freq: Once | INTRAVENOUS | Status: AC
  Administered 2023-07-11: 4 mg via INTRAVENOUS
  Filled 2023-07-10: qty 1

## 2023-07-10 MED ORDER — SODIUM CHLORIDE 0.9 % IV BOLUS
1000.0000 mL | Freq: Once | INTRAVENOUS | Status: AC
  Administered 2023-07-11: 1000 mL via INTRAVENOUS

## 2023-07-10 NOTE — ED Triage Notes (Signed)
 Pt c/o diffuse abd pain onset this AM. Associated NV, no urinary symptoms Hx cholecystectomy, hysterectomy, diverticulosis

## 2023-07-10 NOTE — ED Provider Notes (Signed)
 [place] [facility]PART[facility] AT [facility] Provider Note   CSN: [id number] Arrival date & time: 07/10/23  2010     History {Add pertinent medical, surgical, social history, OB history to HPI:1} Chief complaint: Abdominal pain  Kayla Haynes is a 68 y.o. female.  The history is provided by the patient.  She has history of hypertension, hyperlipidemia and comes in because of vomiting and diarrhea today.  She endorses a sick contact of a family member who has had vomiting and diarrhea.  She is also complaining of diffuse abdominal pain.  She estimates 6 episodes of emesis and diarrhea, denies blood or mucus in stool or emesis.  She denies fever or chills.  She states that she is unable to lay down because of her abdominal pain.  She states a family member gave her some IV fluids today, but she still feels thirsty.   Home Medications Prior to Admission medications   Medication Sig Start Date End Date Taking? Authorizing Provider  amLODipine (NORVASC) 5 MG tablet Take 5 mg by mouth daily. 08/31/22   [provider]  atorvastatin (LIPITOR) 80 MG tablet Take 80 mg by mouth daily. 08/31/22   [provider]  Cholecalciferol 50 MCG (2000 UT) TABS Take 1 tablet by mouth daily. 08/31/22   [provider]  dimenhyDRINATE (DRAMAMINE) 50 MG tablet Take 50 mg by mouth every 8 (eight) hours as needed for nausea.    [provider]  ibuprofen (ADVIL) 200 MG tablet Take 200 mg by mouth every 6 (six) hours as needed for mild pain.    [provider]  irbesartan (AVAPRO) 300 MG tablet Take 300 mg by mouth daily. 08/31/22   [provider]  ondansetron (ZOFRAN-ODT) 4 MG disintegrating tablet Take 1 tablet (4 mg total) by mouth every 6 (six) hours as needed for nausea or vomiting. 10/13/22   Sharyn Creamer, MD  oxyCODONE (ROXICODONE) 5 MG immediate release tablet Take 1 tablet (5 mg total) by mouth every 4 (four) hours as needed for severe pain (pain  score 7-10). 04/08/23   Dione Booze, MD  pantoprazole (PROTONIX) 40 MG tablet Take 1 tablet (40 mg total) by mouth daily. 04/08/23   Dione Booze, MD  potassium chloride SA (KLOR-CON M) 20 MEQ tablet Take 1 tablet (20 mEq total) by mouth 2 (two) times daily. 04/08/23   Dione Booze, MD  promethazine (PHENERGAN) 12.5 MG tablet Take 1 tablet (12.5 mg total) by mouth every 6 (six) hours as needed for nausea or vomiting. 12/29/22   Chesley Noon, MD      Allergies    Patient has no known allergies.    Review of Systems   Review of Systems  All other systems reviewed and are negative.   Physical Exam Updated Vital Signs BP (!) 149/103 (BP Location: Right Arm)   Pulse 98   Temp 98.4 F (36.9 C)   Resp (!) 22   SpO2 100%  Physical Exam Vitals and nursing note reviewed.   68 year old female, pacing the room, but is in no acute distress. Vital signs are significant for elevated respiratory rate and blood pressure. Oxygen saturation is 100%, which is normal. Head is normocephalic and atraumatic. PERRLA, EOMI.  Lungs are clear without rales, wheezes, or rhonchi. Chest is nontender. Heart has regular rate and rhythm without murmur. Abdomen is soft, flat, nontender. Extremities have no swelling, cyanosis or edema, full range of motion is present. Skin is warm and dry without rash. Neurologic: Awake  and alert, moves all extremities equally.  ED Results / Procedures / Treatments   Labs (all labs ordered are listed, but only abnormal results are displayed) Labs Reviewed  COMPREHENSIVE METABOLIC PANEL WITH GFR - Abnormal; Notable for the following components:      Result Value   Potassium 3.1 (*)    CO2 17 (*)    Glucose, Bld 237 (*)    Creatinine, Ser 1.16 (*)    Calcium 11.1 (*)    Total Protein 8.8 (*)    Albumin 5.3 (*)    GFR, Estimated 52 (*)    Anion gap 21 (*)    All other components within normal limits  CBC - Abnormal; Notable for the following components:   WBC 15.7  (*)    MCV 74.4 (*)    MCH 23.9 (*)    Platelets 410 (*)    All other components within normal limits  LIPASE, BLOOD  URINALYSIS, ROUTINE W REFLEX MICROSCOPIC    EKG None  Radiology No results found.  Procedures Procedures  {Document cardiac monitor, telemetry assessment procedure when appropriate:1}  Medications Ordered in ED Medications - No data to display  ED Course/ Medical Decision Making/ A&P   {   Click here for ABCD2, HEART and other calculatorsREFRESH Note before signing :1}                              Medical Decision Making  Nausea, vomiting, diarrhea most likely viral gastroenteritis but consider bowel obstruction.  Doubt serious intra-abdominal pathology such as pancreatitis, diverticulitis.  I have reviewed her laboratory tests, and my interpretation is leukocytosis which is nonspecific, hypokalemia most likely from GI losses, metabolic acidosis likely from vomiting, hypercalcemia which will need to be evaluated as an outpatient.  I have compared her lab results to most recent lab draw on 1220 09/2022, and metabolic acidosis is more severe, hypokalemia is less severe, hypercalcemia is new and possibly secondary to dehydration.  I have ordered IV fluids, morphine for pain, ondansetron for nausea, and I have ordered CT of abdomen and pelvis to rule out serious intra-abdominal pathology.  {Document critical care time when appropriate:1} {Document review of labs and clinical decision tools ie heart score, Chads2Vasc2 etc:1}  {Document your independent review of radiology images, and any outside records:1} {Document your discussion with family members, caretakers, and with consultants:1} {Document social determinants of health affecting pt's care:1} {Document your decision making why or why not admission, treatments were needed:1} Final Clinical Impression(s) / ED Diagnoses Final diagnoses:  None    Rx / DC Orders ED Discharge Orders     None

## 2023-07-10 NOTE — ED Notes (Signed)
 Pt pacing around triage room, unable to sit still during triage. Pt asked about drugs/ alcohol prior to arrival to ED, denies both. Advises that "daughter gave me something in my veins- fluid." When asked for clarification, pt advises that daughter gave her IV fluids, but states she still feels very dehydrated

## 2023-07-11 ENCOUNTER — Emergency Department (HOSPITAL_BASED_OUTPATIENT_CLINIC_OR_DEPARTMENT_OTHER)

## 2023-07-11 MED ORDER — POTASSIUM CHLORIDE CRYS ER 20 MEQ PO TBCR: 20.0000 meq | EXTENDED_RELEASE_TABLET | Freq: Two times a day (BID) | ORAL | 0 refills | Status: DC

## 2023-07-11 MED ORDER — POTASSIUM CHLORIDE CRYS ER 20 MEQ PO TBCR
40.0000 meq | EXTENDED_RELEASE_TABLET | Freq: Once | ORAL | Status: AC
  Administered 2023-07-11: 40 meq via ORAL
  Filled 2023-07-11: qty 2

## 2023-07-11 MED ORDER — POTASSIUM CHLORIDE CRYS ER 20 MEQ PO TBCR
20.0000 meq | EXTENDED_RELEASE_TABLET | Freq: Two times a day (BID) | ORAL | 0 refills | Status: DC
Start: 2023-07-11 — End: 2023-07-11

## 2023-07-11 MED ORDER — IOHEXOL 300 MG/ML  SOLN
100.0000 mL | Freq: Once | INTRAMUSCULAR | Status: AC | PRN
  Administered 2023-07-11: 75 mL via INTRAVENOUS

## 2023-07-11 MED ORDER — HYDROMORPHONE HCL 1 MG/ML IJ SOLN
0.5000 mg | Freq: Once | INTRAMUSCULAR | Status: AC
  Administered 2023-07-11: 0.5 mg via INTRAVENOUS
  Filled 2023-07-11: qty 1

## 2023-07-11 MED ORDER — ONDANSETRON 4 MG PO TBDP: 4.0000 mg | ORAL_TABLET | Freq: Three times a day (TID) | ORAL | 0 refills | Status: DC | PRN

## 2023-07-11 MED ORDER — LOPERAMIDE HCL 2 MG PO CAPS
4.0000 mg | ORAL_CAPSULE | Freq: Once | ORAL | Status: AC
Start: 2023-07-11 — End: 2023-07-11
  Administered 2023-07-11: 4 mg via ORAL
  Filled 2023-07-11: qty 2

## 2023-07-11 NOTE — ED Notes (Signed)
 Pt not able to sit still in her room. States morphine did not help her pain. Dr. Preston Fleeting aware.

## 2023-08-27 ENCOUNTER — Emergency Department (HOSPITAL_BASED_OUTPATIENT_CLINIC_OR_DEPARTMENT_OTHER)

## 2023-08-27 ENCOUNTER — Other Ambulatory Visit: Payer: Self-pay

## 2023-08-27 ENCOUNTER — Emergency Department (HOSPITAL_BASED_OUTPATIENT_CLINIC_OR_DEPARTMENT_OTHER)
Admission: EM | Admit: 2023-08-27 | Discharge: 2023-08-27 | Disposition: A | Discharge status: Home / Self Care | Attending: Emergency Medicine | Admitting: Emergency Medicine

## 2023-08-27 ENCOUNTER — Encounter (HOSPITAL_BASED_OUTPATIENT_CLINIC_OR_DEPARTMENT_OTHER): Payer: Self-pay | Admitting: Emergency Medicine

## 2023-08-27 DIAGNOSIS — I10 Essential (primary) hypertension: Secondary | ICD-10-CM | POA: Insufficient documentation

## 2023-08-27 DIAGNOSIS — Z79899 Other long term (current) drug therapy: Secondary | ICD-10-CM | POA: Diagnosis not present

## 2023-08-27 DIAGNOSIS — R112 Nausea with vomiting, unspecified: Secondary | ICD-10-CM | POA: Diagnosis present

## 2023-08-27 DIAGNOSIS — R1084 Generalized abdominal pain: Secondary | ICD-10-CM | POA: Insufficient documentation

## 2023-08-27 DIAGNOSIS — R110 Nausea: Secondary | ICD-10-CM

## 2023-08-27 LAB — CBC
HCT: 34.4 % — ABNORMAL LOW (ref 36.0–46.0)
Hemoglobin: 11.4 g/dL — ABNORMAL LOW (ref 12.0–15.0)
MCH: 23.8 pg — ABNORMAL LOW (ref 26.0–34.0)
MCHC: 33.1 g/dL (ref 30.0–36.0)
MCV: 72 fL — ABNORMAL LOW (ref 80.0–100.0)
Platelets: 405 10*3/uL — ABNORMAL HIGH (ref 150–400)
RBC: 4.78 MIL/uL (ref 3.87–5.11)
RDW: 13.7 % (ref 11.5–15.5)
WBC: 12.6 10*3/uL — ABNORMAL HIGH (ref 4.0–10.5)
nRBC: 0 % (ref 0.0–0.2)

## 2023-08-27 LAB — COMPREHENSIVE METABOLIC PANEL WITH GFR
ALT: 16 U/L (ref 0–44)
AST: 25 U/L (ref 15–41)
Albumin: 5.2 g/dL — ABNORMAL HIGH (ref 3.5–5.0)
Alkaline Phosphatase: 109 U/L (ref 38–126)
Anion gap: 25 — ABNORMAL HIGH (ref 5–15)
BUN: 23 mg/dL (ref 8–23)
CO2: 18 mmol/L — ABNORMAL LOW (ref 22–32)
Calcium: 10.8 mg/dL — ABNORMAL HIGH (ref 8.9–10.3)
Chloride: 90 mmol/L — ABNORMAL LOW (ref 98–111)
Creatinine, Ser: 1.02 mg/dL — ABNORMAL HIGH (ref 0.44–1.00)
GFR, Estimated: >60 mL/min (ref >60)
Glucose, Bld: 188 mg/dL — ABNORMAL HIGH (ref 70–99)
Potassium: 3 mmol/L — ABNORMAL LOW (ref 3.5–5.1)
Sodium: 132 mmol/L — ABNORMAL LOW (ref 135–145)
Total Bilirubin: 1.1 mg/dL (ref 0.0–1.2)
Total Protein: 8.4 g/dL — ABNORMAL HIGH (ref 6.5–8.1)

## 2023-08-27 LAB — LIPASE, BLOOD: Lipase: 19 U/L (ref 11–51)

## 2023-08-27 MED ORDER — DIPHENHYDRAMINE HCL 50 MG/ML IJ SOLN
25.0000 mg | Freq: Once | INTRAMUSCULAR | Status: AC
  Administered 2023-08-27: 25 mg via INTRAVENOUS
  Filled 2023-08-27: qty 1

## 2023-08-27 MED ORDER — POTASSIUM CHLORIDE CRYS ER 20 MEQ PO TBCR
40.0000 meq | EXTENDED_RELEASE_TABLET | Freq: Once | ORAL | Status: AC
  Administered 2023-08-27: 40 meq via ORAL
  Filled 2023-08-27: qty 2

## 2023-08-27 MED ORDER — IOHEXOL 300 MG/ML  SOLN
100.0000 mL | Freq: Once | INTRAMUSCULAR | Status: AC | PRN
  Administered 2023-08-27: 75 mL via INTRAVENOUS

## 2023-08-27 MED ORDER — SODIUM CHLORIDE 0.9 % IV BOLUS
1000.0000 mL | Freq: Once | INTRAVENOUS | Status: AC
  Administered 2023-08-27: 1000 mL via INTRAVENOUS

## 2023-08-27 MED ORDER — POTASSIUM CHLORIDE ER 10 MEQ PO TBCR: 10.0000 meq | EXTENDED_RELEASE_TABLET | Freq: Every day | ORAL | 0 refills | Status: DC

## 2023-08-27 MED ORDER — FENTANYL CITRATE PF 50 MCG/ML IJ SOSY
50.0000 ug | PREFILLED_SYRINGE | Freq: Once | INTRAMUSCULAR | Status: AC
  Administered 2023-08-27: 50 ug via INTRAVENOUS
  Filled 2023-08-27: qty 1

## 2023-08-27 MED ORDER — MIDAZOLAM HCL 2 MG/2ML IJ SOLN
1.0000 mg | Freq: Once | INTRAMUSCULAR | Status: AC
Start: 2023-08-27 — End: 2023-08-27
  Administered 2023-08-27: 1 mg via INTRAVENOUS
  Filled 2023-08-27: qty 2

## 2023-08-27 MED ORDER — PROCHLORPERAZINE EDISYLATE 10 MG/2ML IJ SOLN
10.0000 mg | Freq: Once | INTRAMUSCULAR | Status: AC
  Administered 2023-08-27: 10 mg via INTRAVENOUS
  Filled 2023-08-27: qty 2

## 2023-08-27 MED ORDER — ONDANSETRON HCL 4 MG/2ML IJ SOLN
4.0000 mg | Freq: Once | INTRAMUSCULAR | Status: AC
  Administered 2023-08-27: 4 mg via INTRAVENOUS
  Filled 2023-08-27: qty 2

## 2023-08-27 MED ORDER — ONDANSETRON 4 MG PO TBDP: 4.0000 mg | ORAL_TABLET | Freq: Three times a day (TID) | ORAL | 0 refills | Status: DC | PRN

## 2023-08-27 NOTE — ED Triage Notes (Signed)
 Pt c/o NV since last night; c/o generalized abd pain

## 2023-08-27 NOTE — Discharge Instructions (Addendum)
 You appear to have a stomach bug. Symptoms typically resolve on their own within the next 2-3 days. Please eat a bland diet with foods such as rice, toast, crackers, and then advance your diet as tolerated.  You have been prescribed Zofran  (ondansetron ) for nausea and vomiting. You may take this every 8 hours as needed for nausea and vomiting. This medication dissolves under the tongue. You do not need to swallow it.   Your kidney lab, creatinine, was slightly elevated today.  This likely is because you are dehydrated.  Please keep well-hydrated at home with water.  You were found to have a low potassium level on your labs today.  You were given a potassium supplement here today, but I have also prescribed a potassium supplement for you to take at home.  Please increase your dietary intake of calcium with foods such as avocados, potatoes, bananas, spinach, salmon. Please have your PCP monitor this value.    Your CT scan did not show any abnormalities to explain your nausea or stomach pain today.  Follow-up instructions: Please follow-up with your primary care provider for further evaluation of your symptoms if you are not feeling better within the next 3 days.   Return instructions:  Please return to the Emergency Department if you experience worsening symptoms.  RETURN IMMEDIATELY IF you develop shortness of breath, confusion or altered mental status, a new rash, become dizzy, faint, or poorly responsive, or are unable to be cared for at home. Please return if you have persistent vomiting and cannot keep down fluids or develop a fever that is not controlled by tylenol  or motrin.   Please return if you have any other emergent concerns.

## 2023-08-27 NOTE — ED Provider Notes (Signed)
 [place] [facility] Provider Note   CSN: [id number] Arrival date & time: 08/27/23  1144     History  Chief Complaint  Patient presents with   Emesis   Abdominal Pain    Kayla Haynes is a 68 y.o. female with history of hypertension, hypercholesterolemia, presents with concern for nausea and nonbloody vomiting that have been ongoing since last night.  She also reports some generalized abdominal pain.  Denies any fever, chills, dysuria, hematuria, or increased frequency.  Last bowel movement was this morning which was nonbloody diarrhea.  Denies any recent travel, recent hospitalizations, or recent antibiotic use.   Emesis Associated symptoms: abdominal pain   Abdominal Pain Associated symptoms: vomiting        Home Medications Prior to Admission medications   Medication Sig Start Date End Date Taking? Authorizing Provider  ondansetron  (ZOFRAN -ODT) 4 MG disintegrating tablet Take 1 tablet (4 mg total) by mouth every 8 (eight) hours as needed for nausea or vomiting. 08/27/23  Yes Rexie Catena, PA-C  potassium chloride  (KLOR-CON ) 10 MEQ tablet Take 1 tablet (10 mEq total) by mouth daily. 08/27/23  Yes Rexie Catena, PA-C  amLODipine (NORVASC) 5 MG tablet Take 5 mg by mouth daily. 08/31/22   [provider]  atorvastatin (LIPITOR) 80 MG tablet Take 80 mg by mouth daily. 08/31/22   [provider]  Cholecalciferol 50 MCG (2000 UT) TABS Take 1 tablet by mouth daily. 08/31/22   [provider]  dimenhyDRINATE (DRAMAMINE) 50 MG tablet Take 50 mg by mouth every 8 (eight) hours as needed for nausea.    [provider]  ibuprofen (ADVIL) 200 MG tablet Take 200 mg by mouth every 6 (six) hours as needed for mild pain.    [provider]  irbesartan (AVAPRO) 300 MG tablet Take 300 mg by mouth daily. 08/31/22   [provider]  oxyCODONE  (ROXICODONE ) 5 MG immediate release tablet Take 1 tablet (5 mg  total) by mouth every 4 (four) hours as needed for severe pain (pain score 7-10). 04/08/23   Alissa April, MD  pantoprazole  (PROTONIX ) 40 MG tablet Take 1 tablet (40 mg total) by mouth daily. 04/08/23   Alissa April, MD  potassium chloride  SA (KLOR-CON  M) 20 MEQ tablet Take 1 tablet (20 mEq total) by mouth 2 (two) times daily. 07/11/23   Alissa April, MD  promethazine  (PHENERGAN ) 12.5 MG tablet Take 1 tablet (12.5 mg total) by mouth every 6 (six) hours as needed for nausea or vomiting. 12/29/22   Twilla Galea, MD      Allergies    Patient has no known allergies.    Review of Systems   Review of Systems  Gastrointestinal:  Positive for abdominal pain and vomiting.    Physical Exam Updated Vital Signs BP (!) 119/101   Pulse 88   Temp (!) 97 F (36.1 C)   Resp 20   Ht 5\' 3"  (1.6 m)   Wt 59.9 kg   SpO2 100%   BMI 23.38 kg/m  Physical Exam Vitals and nursing note reviewed.  Constitutional:      General: She is not in acute distress.    Appearance: She is well-developed.     Comments: No active vomiting.  Upon initial evaluation, was pacing back-and-forth in the room  HENT:     Head: Normocephalic and atraumatic.  Eyes:     Conjunctiva/sclera: Conjunctivae normal.  Cardiovascular:     Rate and Rhythm: Normal rate and regular rhythm.  Heart sounds: No murmur heard. Pulmonary:     Effort: Pulmonary effort is normal. No respiratory distress.     Breath sounds: Normal breath sounds.  Abdominal:     Palpations: Abdomen is soft.     Tenderness: There is no abdominal tenderness. There is no right CVA tenderness or left CVA tenderness.  Musculoskeletal:        General: No swelling.     Cervical back: Neck supple.  Skin:    General: Skin is warm and dry.     Capillary Refill: Capillary refill takes less than 2 seconds.  Neurological:     Mental Status: She is alert.  Psychiatric:        Mood and Affect: Mood normal.     ED Results / Procedures / Treatments   Labs (all  labs ordered are listed, but only abnormal results are displayed) Labs Reviewed  COMPREHENSIVE METABOLIC PANEL WITH GFR - Abnormal; Notable for the following components:      Result Value   Sodium 132 (*)    Potassium 3.0 (*)    Chloride 90 (*)    CO2 18 (*)    Glucose, Bld 188 (*)    Creatinine, Ser 1.02 (*)    Calcium 10.8 (*)    Total Protein 8.4 (*)    Albumin 5.2 (*)    Anion gap 25 (*)    All other components within normal limits  CBC - Abnormal; Notable for the following components:   WBC 12.6 (*)    Hemoglobin 11.4 (*)    HCT 34.4 (*)    MCV 72.0 (*)    MCH 23.8 (*)    Platelets 405 (*)    All other components within normal limits  LIPASE, BLOOD  URINALYSIS, ROUTINE W REFLEX MICROSCOPIC  URINE DRUG SCREEN    EKG None  Radiology CT ABDOMEN PELVIS W CONTRAST Result Date: 08/27/2023 CLINICAL DATA:  Generalized abdominal pain, nausea and vomiting since yesterday EXAM: CT ABDOMEN AND PELVIS WITH CONTRAST TECHNIQUE: Multidetector CT imaging of the abdomen and pelvis was performed using the standard protocol following bolus administration of intravenous contrast. RADIATION DOSE REDUCTION: This exam was performed according to the departmental dose-optimization program which includes automated exposure control, adjustment of the mA and/or kV according to patient size and/or use of iterative reconstruction technique. CONTRAST:  75mL OMNIPAQUE  IOHEXOL  300 MG/ML  SOLN COMPARISON:  07/11/2023 FINDINGS: Lower chest: Scarring or subsegmental atelectasis within the right middle lobe. No acute pleural or parenchymal lung disease. Hepatobiliary: Hepatic steatosis. No focal liver abnormality. Cholecystectomy. No biliary duct dilation. Pancreas: Unremarkable. No pancreatic ductal dilatation or surrounding inflammatory changes. Spleen: Normal in size without focal abnormality. Adrenals/Urinary Tract: Stable nonspecific thickening of the bilateral adrenal glands. The kidneys are stable without  acute abnormality. No urinary tract calculi or obstructive uropathy. Bladder is only minimally distended, limiting its evaluation. No gross abnormalities. Stomach/Bowel: No bowel obstruction or ileus. Normal appendix right lower quadrant. Diverticulosis of the sigmoid colon without evidence of acute diverticulitis. No bowel wall thickening or inflammatory change. Vascular/Lymphatic: Atherosclerosis of the abdominal aorta. There is extensive atherosclerosis of the bilateral iliac vessels with multifocal high-grade stenoses of the bilateral common and external iliac arteries. Please correlate with any symptoms of lower extremity claudication. No pathologic adenopathy. Reproductive: Status post hysterectomy. No adnexal masses. Other: No free fluid or free intraperitoneal gas. No abdominal wall hernia. Musculoskeletal: No acute or destructive bony abnormalities. Reconstructed images demonstrate no additional findings. IMPRESSION: 1. No acute intra-abdominal or  intrapelvic process. 2. Sigmoid diverticulosis without diverticulitis. 3. Hepatic steatosis. 4. Aortic Atherosclerosis (ICD10-I70.0). Significant atherosclerosis of the bilateral iliac arteries, please correlate with any history of lower extremity claudication. Electronically Signed   By: Bobbye Burrow M.D.   On: 08/27/2023 14:45    Procedures Procedures    Medications Ordered in ED Medications  ondansetron  (ZOFRAN ) injection 4 mg (4 mg Intravenous Given 08/27/23 1253)  fentaNYL  (SUBLIMAZE ) injection 50 mcg (50 mcg Intravenous Given 08/27/23 1258)  sodium chloride  0.9 % bolus 1,000 mL (0 mLs Intravenous Stopped 08/27/23 1414)  potassium chloride  SA (KLOR-CON  M) CR tablet 40 mEq (40 mEq Oral Given 08/27/23 1349)  midazolam (VERSED) injection 1 mg (1 mg Intravenous Given 08/27/23 1348)  diphenhydrAMINE  (BENADRYL ) injection 25 mg (25 mg Intravenous Given 08/27/23 1349)  prochlorperazine (COMPAZINE) injection 10 mg (10 mg Intravenous Given 08/27/23 1401)   iohexol  (OMNIPAQUE ) 300 MG/ML solution 100 mL (75 mLs Intravenous Contrast Given 08/27/23 1426)    ED Course/ Medical Decision Making/ A&P                                 Medical Decision Making Amount and/or Complexity of Data Reviewed Labs: ordered. Radiology: ordered.  Risk Prescription drug management.     Differential diagnosis includes but is not limited to Cholelithiasis, cholangitis, choledocholithiasis, peptic ulcer, gastritis, gastroenteritis, appendicitis, IBS, IBD, DKA, nephrolithiasis, UTI, pyelonephritis, pancreatitis, diverticulitis, mesenteric ischemia, abdominal aortic aneurysm, small bowel obstruction, volvulus, testicular torsion in males, ovarian torsion and pregnancy related concerns in females of childbearing age    ED Course:  Upon initial evaluation, patient is well-appearing, stable vitals.  Pacing back and forth in the room.  No active vomiting.  Abdomen soft nontender to palpation.  Labs Ordered: I Ordered, and personally interpreted labs.  The pertinent results include:   CMP with hyponatremia 132, hypokalemia 3.0.  Elevated glucose 188.  Mildly elevated creatinine of 1.02. Anion gap 25 CBC with leukocytosis of 12.6 Lipase within normal limits Patient unable to provide urine sample  Imaging Studies ordered: I ordered imaging studies including CT abdomen pelvis I independently visualized the imaging with scope of interpretation limited to determining acute life threatening conditions related to emergency care. Imaging showed no acute abnormalities I agree with the radiologist interpretation   Medications Given: NS bolus Compazine, Benadryl , Zofran  for nausea 40 mEq KCl tablet for hypokalemia Versed for anxiety Fentanyl  for pain  Upon re-evaluation, patient resting comfortably in the stretcher.  Remains with stable vitals.  She has not had any episodes of emesis here in the emergency room.  Reports her abdominal pain is improved. Patient did  have hyponatremia and hypokalemia, this was treated with NS bolus and potassium supplementation which patient was able to tolerate orally without difficulty. Given abdomen soft nontender, no elevation LFTs, lipase, and CT abdomen pelvis without acute abnormality, low concern for emergent intra-abdominal pathology at this time.  Suspect she may have gastroenteritis given the reported vomiting and diarrhea.  I have low concern for C. difficile given no recent hospitalizations or recent antibiotic use, and diarrhea only started today.  Low concern for other infectious diarrhea etiology given diarrhea was reported to be nonbloody, no fevers, no recent travel.  She was unable to provide urine sample, but denies any urinary symptoms.  Lower concern for UTI or pyelonephritis.  Feel she is stable and appropriate for discharge home.    Impression: Gastroenteritis  Disposition:  The patient was  discharged home with instructions to take Zofran  as prescribed for nausea at home.  Take potassium supplements as prescribed at home.  Eat a bland diet, then advance diet as tolerated.  Keep well-hydrated at home as her creatinine was slightly bumped here.  Follow-up with PCP if symptoms not improving within the next 3 days. Return precautions given.    This chart was dictated using voice recognition software, Dragon. Despite the best efforts of this provider to proofread and correct errors, errors may still occur which can change documentation meaning.          Final Clinical Impression(s) / ED Diagnoses Final diagnoses:  Nausea    Rx / DC Orders ED Discharge Orders          Ordered    ondansetron  (ZOFRAN -ODT) 4 MG disintegrating tablet  Every 8 hours PRN        08/27/23 1529    potassium chloride  (KLOR-CON ) 10 MEQ tablet  Daily        08/27/23 1529              Rexie Catena, PA-C 08/27/23 1806    Scarlette Currier, MD 08/28/23 [phone]

## 2023-09-01 NOTE — ED Provider Notes (Signed)
 [facility]  ED Provider Note  Kayla Haynes 68 y.o. female DOB: 08-May-1955 MRN: [id number] History   Chief Complaint  Patient presents with  . Abdominal Pain    Seen at [place] Saturday for same. Middle abd pain and nausea.    He patient is a 68 year old female with history of cholecystectomy who presents to the emergency department with abdominal pain which she reports that sharp.  She states it started at 5 AM.  She has also been to [facility] and had a workup that which was negative.  She has taken oxycodone  without relief.  She states her last bowel movement was 2 days ago.  Although she is also states that she had diarrhea yesterday.  She denies any fever or vomiting.  She has taken a Tums at home to try to alleviate the pain.   Abdominal Pain      History reviewed. No pertinent past medical history.  Past Surgical History:  Procedure Laterality Date  . Cholecystectomy      Social History   Substance and Sexual Activity  Alcohol Use None   Social History   Tobacco Use  Smoking Status Not on file  Smokeless Tobacco Not on file   E-Cigarettes  . Vaping Use    . Start Date    . Cartridges/Day    . Quit Date     Social History   Substance and Sexual Activity  Drug Use Not on file         No Known Allergies  Home Medications   ONDANSETRON  (ZOFRAN -ODT) 4 MG DISINTEGRATING TABLET    Take one tablet (4 mg dose) by mouth.   POTASSIUM CHLORIDE  10 MEQ TABLET    Take one tablet (10 mEq dose) by mouth daily.    Primary Survey   Exposure    No visible abdominal trauma.       Review of Systems   Review of Systems  Gastrointestinal:  Positive for abdominal pain.    Physical Exam   ED Triage Vitals [09/01/23 1350]  BP (!) 168/99  Heart Rate 75  Resp 18  SpO2 100 %  Temp 97.4 F (36.3 C)    Physical Exam  Nursing note and vitals reviewed. Constitutional: She appears well-developed. She appears to be in  pain, appears distressed and does not appear ill.  Patient is pacing in the room unable to get comfortable.  HENT:  Head: Normocephalic and atraumatic.  Neck: Normal range of motion. Neck supple.  Cardiovascular: Normal rate, regular rhythm and normal heart sounds.  No audible murmur. No friction rub and gallop.  Pulmonary/Chest: Respiratory effort normal and breath sounds normal.  Abdominal: Soft. There is no abdominal tenderness. There is no guarding and no rebound. Abdomen not distended. Bowel sounds are normal. No visible abdominal trauma.  Significant redundant tissue.  Patient has lost significant weight in the past year.  Unable to elicit pain with abdominal exam.  Pain episodes appear to be out of proportion to exam.  Musculoskeletal:     Cervical back: Normal range of motion and neck supple.   Neurological: She is alert and oriented to person, place, and time.  Skin: Skin is warm. Skin is dry. No rash noted.  Psychiatric: She has a normal mood and affect.     ED Course   Lab results:   CBC AND DIFFERENTIAL - Abnormal      Result Value   WBC 10.4     RBC 4.46  HGB 10.6 (*)    HCT 32.2 (*)    MCV 72.2 (*)    MCH 23.8 (*)    MCHC 32.9     Plt Ct 387     RDW SD 34.0 (*)    MPV 10.3     NRBC% 0.0     Absolute NRBC Count 0.00     NEUTROPHIL % 75.3     LYMPHOCYTE % 14.5     MONOCYTE % 9.4     Eosinophil % 0.0     BASOPHIL % 0.4     IG% 0.4     ABSOLUTE NEUTROPHIL COUNT 7.83 (*)    ABSOLUTE LYMPHOCYTE COUNT 1.51     Absolute Monocyte Count 0.98 (*)    Absolute Eosinophil Count 0.00     Absolute Basophil Count 0.04     Absolute Immature Granulocyte Count 0.04 (*)   COMPREHENSIVE METABOLIC PANEL - Abnormal   Na 132 (*)    Potassium 3.2 (*)    Cl 88 (*)    CO2 24     AGAP 20 (*)    Glucose 152 (*)    BUN 16     Creatinine 1.25 (*)    Ca 11.1 (*)    ALK PHOS 105     T Bili 1.3 (*)    Total Protein 7.3     Alb 4.9 (*)    GLOBULIN 2.4      ALBUMIN/GLOBULIN RATIO 2.0     BUN/CREAT RATIO 12.8     ALT 15     AST 21     eGFR 48 (*)    Comment: Normal GFR (glomerular filtration rate) > 60 mL/min/1.73 meters squared, < 60 may include impaired kidney function. Calculation based on the Chronic Kidney Disease Epidemiology Collaboration (CK-EPI)equation refit without adjustment for race.  URINALYSIS W/MICRO REFLEX CULTURE - SYMPTOMATIC - Abnormal   Urine Color Yellow     Urine Clarity Cloudy (*)    Urine Specific Gravity 1.027     Urine pH 5.5     Urine Protein - Dipstick 50 (*)    Urine Glucose 50 (*)    Urine Ketones 20 (*)    Urine Bilirubin Negative     Urine Blood Negative     Urine Nitrite Negative     Urine Urobilinogen 3 (*)    Urine Leukocyte Esterase Negative     Urine Squamous Epithelial Cells 20-30 (*)    Urine Bacteria 4+ (*)    Urine Mucous 3+ (*)    UA Microscopic Yes Micro (*)    Narrative:    Does not meet criteria for reflex to Urine Culture.  URINE DRUGS OF ABUSE SCRN - Abnormal   Ur PH DOA Scr 5.5     Amphet Scr Negative     Barb Scr Negative     Benzo Scr Negative     Cannab Scr Positive (*)    Cocaine Scr Negative     Opiates Scr Negative     Meth Scr Negative     Oxyco Scr Positive (*)    Fentanyl  Scr Negative     Buprenorphine Screen Negative     Narrative:    Please Note Detection Levels Below:                            Amphetamines  1000 ng/mL  Barbiturates                    200 ng/mL  Benzodiazepines                 200 ng/mL  Cannabinoids (Marijuana, THC)   50 ng/mL  Cocaine                         300 ng/mL  Opiates                         300 ng/mL  Methadone                       300 ng/mL  Oxycodone                        100 ng/mL  Fentanyl                           5 ng/mL  Buprenorphine                     5 ng/mL  This test is a screening test and results are only to be used for medical purposes.  If confirmation of positive results are needed,  please order confirmation by GC/MS for each drug that needs confirmation.  Urine specimens are retained for 5 days.   LIPASE - Normal   Lipase 29    LACTIC ACID  REFLEXED LACTIC ACID  LIGHT BLUE TOP  GOLD SST    Imaging: No data to display    ECG: ECG Results   None                                                                     Pre-Sedation Procedures  ED Course as of 09/01/23 1912  Dow Gip Olson's Documentation  Thu Sep 01, 2023  1756 Hemoglobin(!): 10.6  1756 Hematocrit(!): 32.2 Slightly low  1757 BUN: 16  1757 Creatinine(!): 1.25 Slightly high  1757 Ketones(!): 20  1757 Squam Epithel, UA(!): 20-30 Contaminated specimen   Medical Decision Making Patient is a 68 year old female who presents to the emergency department with severe abdominal pain.  Patient's labs are similar to what she had yesterday.  Hemoglobin and hematocrit are slightly decreased.  Her creatinine is 1.25, she does have some ketones in her urine.  Does not appear infected.  CTA dissection protocol ordered.  Care of patient will be turned over to my colleague at the end of my shift.  Amount and/or Complexity of Data Reviewed External Data Reviewed: labs, radiology and notes. Labs: ordered. Decision-making details documented in ED Course. Radiology: ordered.  Risk Prescription drug management.           Provider Communication  New Prescriptions   No medications on file    Modified Medications   No medications on file    Discontinued Medications   No medications on file    Clinical Impression Final diagnoses:  Abdominal pain, unspecified abdominal location    ED Disposition     None  Electronically signed by:    Dow Hollace Slain, MD 09/01/23 [phone]

## 2023-09-02 NOTE — ED Provider Notes (Signed)
 Patient seen after prior ED provider.  Patient is  Much improved on reevaluation.  CT imaging is without clear acute pathology.  Patient with some degree of vascular stenoses noted.  Patient with good blood flow.  Patient without clear evidence of acute ischemia to her gut.  Patient desires discharge now.  She understands need for close outpatient follow-up.  She is advised to follow-up closely with vascular.  Strict return precautions given and understood.   Kayla Johnetta Galloway, MD 09/02/23 [phone]

## 2024-01-12 ENCOUNTER — Encounter (HOSPITAL_BASED_OUTPATIENT_CLINIC_OR_DEPARTMENT_OTHER): Payer: Self-pay | Admitting: Emergency Medicine

## 2024-01-12 ENCOUNTER — Observation Stay (HOSPITAL_BASED_OUTPATIENT_CLINIC_OR_DEPARTMENT_OTHER)
Admission: EM | Admit: 2024-01-12 | Discharge: 2024-01-13 | Disposition: A | Discharge status: Home / Self Care | Attending: Internal Medicine | Admitting: Internal Medicine

## 2024-01-12 ENCOUNTER — Emergency Department (HOSPITAL_BASED_OUTPATIENT_CLINIC_OR_DEPARTMENT_OTHER)

## 2024-01-12 ENCOUNTER — Other Ambulatory Visit: Payer: Self-pay

## 2024-01-12 DIAGNOSIS — F1220 Cannabis dependence, uncomplicated: Secondary | ICD-10-CM | POA: Diagnosis not present

## 2024-01-12 DIAGNOSIS — E785 Hyperlipidemia, unspecified: Secondary | ICD-10-CM | POA: Diagnosis not present

## 2024-01-12 DIAGNOSIS — R112 Nausea with vomiting, unspecified: Secondary | ICD-10-CM | POA: Diagnosis present

## 2024-01-12 DIAGNOSIS — Z79899 Other long term (current) drug therapy: Secondary | ICD-10-CM | POA: Diagnosis not present

## 2024-01-12 DIAGNOSIS — K219 Gastro-esophageal reflux disease without esophagitis: Secondary | ICD-10-CM | POA: Insufficient documentation

## 2024-01-12 DIAGNOSIS — Z743 Need for continuous supervision: Secondary | ICD-10-CM | POA: Diagnosis not present

## 2024-01-12 DIAGNOSIS — F17200 Nicotine dependence, unspecified, uncomplicated: Secondary | ICD-10-CM | POA: Diagnosis present

## 2024-01-12 DIAGNOSIS — Z720 Tobacco use: Secondary | ICD-10-CM | POA: Insufficient documentation

## 2024-01-12 DIAGNOSIS — I10 Essential (primary) hypertension: Secondary | ICD-10-CM | POA: Diagnosis not present

## 2024-01-12 LAB — CBC WITH DIFFERENTIAL/PLATELET
Abs Immature Granulocytes: 0.03 K/uL (ref 0.00–0.07)
Basophils Absolute: 0 K/uL (ref 0.0–0.1)
Basophils Relative: 0 %
Eosinophils Absolute: 0 K/uL (ref 0.0–0.5)
Eosinophils Relative: 0 %
HCT: 36.9 % (ref 36.0–46.0)
Hemoglobin: 11.8 g/dL — ABNORMAL LOW (ref 12.0–15.0)
Immature Granulocytes: 0 %
Lymphocytes Relative: 24 %
Lymphs Abs: 2 K/uL (ref 0.7–4.0)
MCH: 23.7 pg — ABNORMAL LOW (ref 26.0–34.0)
MCHC: 32 g/dL (ref 30.0–36.0)
MCV: 74.2 fL — ABNORMAL LOW (ref 80.0–100.0)
Monocytes Absolute: 0.8 K/uL (ref 0.1–1.0)
Monocytes Relative: 10 %
Neutro Abs: 5.4 K/uL (ref 1.7–7.7)
Neutrophils Relative %: 66 %
Platelets: 310 K/uL (ref 150–400)
RBC: 4.97 MIL/uL (ref 3.87–5.11)
RDW: 14.1 % (ref 11.5–15.5)
WBC: 8.2 K/uL (ref 4.0–10.5)
nRBC: 0 % (ref 0.0–0.2)

## 2024-01-12 LAB — BASIC METABOLIC PANEL WITH GFR
Anion gap: 20 — ABNORMAL HIGH (ref 5–15)
BUN: 14 mg/dL (ref 8–23)
CO2: 20 mmol/L — ABNORMAL LOW (ref 22–32)
Calcium: 10.2 mg/dL (ref 8.9–10.3)
Chloride: 98 mmol/L (ref 98–111)
Creatinine, Ser: 0.77 mg/dL (ref 0.44–1.00)
GFR, Estimated: >60 mL/min (ref >60)
Glucose, Bld: 159 mg/dL — ABNORMAL HIGH (ref 70–99)
Potassium: 3.6 mmol/L (ref 3.5–5.1)
Sodium: 138 mmol/L (ref 135–145)

## 2024-01-12 LAB — COMPREHENSIVE METABOLIC PANEL WITH GFR
ALT: 21 U/L (ref 0–44)
AST: 28 U/L (ref 15–41)
Albumin: 5.1 g/dL — ABNORMAL HIGH (ref 3.5–5.0)
Alkaline Phosphatase: 114 U/L (ref 38–126)
Anion gap: 18 — ABNORMAL HIGH (ref 5–15)
BUN: 16 mg/dL (ref 8–23)
CO2: 23 mmol/L (ref 22–32)
Calcium: 11.2 mg/dL — ABNORMAL HIGH (ref 8.9–10.3)
Chloride: 96 mmol/L — ABNORMAL LOW (ref 98–111)
Creatinine, Ser: 0.79 mg/dL (ref 0.44–1.00)
GFR, Estimated: >60 mL/min (ref >60)
Glucose, Bld: 119 mg/dL — ABNORMAL HIGH (ref 70–99)
Potassium: 4 mmol/L (ref 3.5–5.1)
Sodium: 137 mmol/L (ref 135–145)
Total Bilirubin: 0.7 mg/dL (ref 0.0–1.2)
Total Protein: 8.3 g/dL — ABNORMAL HIGH (ref 6.5–8.1)

## 2024-01-12 LAB — RESP PANEL BY RT-PCR (RSV, FLU A&B, COVID)  RVPGX2
Influenza A by PCR: NEGATIVE
Influenza B by PCR: NEGATIVE
Resp Syncytial Virus by PCR: NEGATIVE
SARS Coronavirus 2 by RT PCR: NEGATIVE

## 2024-01-12 LAB — URINE DRUG SCREEN
Amphetamines: NEGATIVE
Barbiturates: NEGATIVE
Benzodiazepines: NEGATIVE
Cocaine: NEGATIVE
Fentanyl: NEGATIVE
Methadone Scn, Ur: NEGATIVE
Opiates: NEGATIVE
Tetrahydrocannabinol: POSITIVE — AB

## 2024-01-12 LAB — URINALYSIS, ROUTINE W REFLEX MICROSCOPIC
Bilirubin Urine: NEGATIVE
Glucose, UA: NEGATIVE mg/dL
Hgb urine dipstick: NEGATIVE
Ketones, ur: NEGATIVE mg/dL
Leukocytes,Ua: NEGATIVE
Nitrite: NEGATIVE
Protein, ur: NEGATIVE mg/dL
Specific Gravity, Urine: 1.026 (ref 1.005–1.030)
pH: 5.5 (ref 5.0–8.0)

## 2024-01-12 LAB — LIPASE, BLOOD: Lipase: 30 U/L (ref 11–51)

## 2024-01-12 MED ORDER — ONDANSETRON HCL 4 MG/2ML IJ SOLN: 4.0000 mg | Freq: Four times a day (QID) | INTRAMUSCULAR | Status: DC | PRN

## 2024-01-12 MED ORDER — ONDANSETRON HCL 4 MG/2ML IJ SOLN
4.0000 mg | Freq: Once | INTRAMUSCULAR | Status: AC
  Administered 2024-01-12: 4 mg via INTRAVENOUS
  Filled 2024-01-12: qty 2

## 2024-01-12 MED ORDER — MORPHINE SULFATE (PF) 4 MG/ML IV SOLN
4.0000 mg | Freq: Once | INTRAVENOUS | Status: AC
  Administered 2024-01-12: 4 mg via INTRAVENOUS
  Filled 2024-01-12: qty 1

## 2024-01-12 MED ORDER — BOOST / RESOURCE BREEZE PO LIQD CUSTOM
1.0000 | Freq: Three times a day (TID) | ORAL | Status: DC
  Administered 2024-01-13: 1 via ORAL

## 2024-01-12 MED ORDER — MORPHINE SULFATE (PF) 2 MG/ML IV SOLN
2.0000 mg | INTRAVENOUS | Status: DC | PRN
  Administered 2024-01-12: 2 mg via INTRAVENOUS
  Filled 2024-01-12: qty 1

## 2024-01-12 MED ORDER — ZOLPIDEM TARTRATE 5 MG PO TABS
2.5000 mg | ORAL_TABLET | Freq: Once | ORAL | Status: AC
  Administered 2024-01-12: 2.5 mg via ORAL
  Filled 2024-01-12: qty 1

## 2024-01-12 MED ORDER — SODIUM CHLORIDE 0.9 % IV BOLUS
500.0000 mL | Freq: Once | INTRAVENOUS | Status: AC
  Administered 2024-01-12: 500 mL via INTRAVENOUS

## 2024-01-12 MED ORDER — ENOXAPARIN SODIUM 30 MG/0.3ML IJ SOSY
30.0000 mg | PREFILLED_SYRINGE | Freq: Every day | INTRAMUSCULAR | Status: DC
  Filled 2024-01-12: qty 0.3

## 2024-01-12 MED ORDER — AMLODIPINE BESYLATE 5 MG PO TABS
5.0000 mg | ORAL_TABLET | Freq: Once | ORAL | Status: AC
  Administered 2024-01-12: 5 mg via ORAL
  Filled 2024-01-12: qty 1

## 2024-01-12 MED ORDER — PROMETHAZINE (PHENERGAN) 6.25MG IN NS 50ML IVPB
6.2500 mg | Freq: Once | INTRAVENOUS | Status: AC
  Administered 2024-01-12: 6.25 mg via INTRAVENOUS
  Filled 2024-01-12: qty 50

## 2024-01-12 MED ORDER — ONDANSETRON HCL 4 MG PO TABS: 4.0000 mg | ORAL_TABLET | Freq: Four times a day (QID) | ORAL | Status: DC | PRN

## 2024-01-12 MED ORDER — LACTATED RINGERS IV BOLUS
1000.0000 mL | Freq: Once | INTRAVENOUS | Status: AC
  Administered 2024-01-12: 1000 mL via INTRAVENOUS

## 2024-01-12 MED ORDER — HYDROXYZINE HCL 25 MG PO TABS
25.0000 mg | ORAL_TABLET | Freq: Once | ORAL | Status: AC
  Administered 2024-01-12: 25 mg via ORAL
  Filled 2024-01-12: qty 1

## 2024-01-12 MED ORDER — ATORVASTATIN CALCIUM 40 MG PO TABS
80.0000 mg | ORAL_TABLET | Freq: Every day | ORAL | Status: DC
  Administered 2024-01-13: 80 mg via ORAL
  Filled 2024-01-12: qty 2

## 2024-01-12 MED ORDER — METOPROLOL TARTRATE 5 MG/5ML IV SOLN
5.0000 mg | Freq: Four times a day (QID) | INTRAVENOUS | Status: DC | PRN
  Administered 2024-01-12: 5 mg via INTRAVENOUS
  Filled 2024-01-12: qty 5

## 2024-01-12 MED ORDER — DEXTROSE IN LACTATED RINGERS 5 % IV SOLN: INTRAVENOUS | Status: DC

## 2024-01-12 MED ORDER — PROMETHAZINE HCL 25 MG/ML IJ SOLN
INTRAMUSCULAR | Status: AC
  Administered 2024-01-12: 25 mg
  Filled 2024-01-12: qty 1

## 2024-01-12 MED ORDER — IOHEXOL 300 MG/ML  SOLN
100.0000 mL | Freq: Once | INTRAMUSCULAR | Status: AC | PRN
  Administered 2024-01-12: 80 mL via INTRAVENOUS

## 2024-01-12 NOTE — ED Notes (Signed)
 Patient transported to CT

## 2024-01-12 NOTE — ED Notes (Signed)
 Pt given sprite (237 mL) and teddy grahams

## 2024-01-12 NOTE — ED Notes (Signed)
 Called [facility] to transport to [facility] rm#[id number]

## 2024-01-12 NOTE — ED Notes (Signed)
 Pt had emesis episode x1 upon returning form CT. PA made aware.

## 2024-01-12 NOTE — H&P (Signed)
 History and Physical    Patient: Kayla Haynes FMW:[id number] DOB: [birth year] DOA: 01/12/2024 DOS: the patient was seen and examined on 01/12/2024 PCP: [facility]  Patient coming from: Home  Chief Complaint:  Chief Complaint  Patient presents with   Abdominal Pain   HPI: Kayla Haynes is a 68 y.o. female with medical history significant of essential hypertension, hyperlipidemia, history of cholecystectomy, THC use who presented to the ER at [facility] with significant nausea and vomiting as well as abdominal pain.  Patient reported symptoms started yesterday after taking some lemonade.  Has continued to have significant vomiting with mild abdominal pain.  No fever no chills no hematemesis no melena no bright red blood per rectum.  Patient was seen in the ER and was treated with Zofran  and IV Phenergan  but no relief.  She has had prior history of intractable nausea with vomiting.  Patient drug screen was positive for marijuana.  CT abdomen pelvis was nonrevealing.  Due to ongoing symptoms not relieved patient has been admitted with intractable nausea vomiting for symptomatic management and observation.  Review of Systems: As mentioned in the history of present illness. All other systems reviewed and are negative. Past Medical History:  Diagnosis Date   High cholesterol    Hypertension    Past Surgical History:  Procedure Laterality Date   ABDOMINAL HYSTERECTOMY     CHOLECYSTECTOMY     EXTRACORPOREAL SHOCK WAVE LITHOTRIPSY Left 09/16/2022   Procedure: EXTRACORPOREAL SHOCK WAVE LITHOTRIPSY (ESWL);  Surgeon: Penne Knee, MD;  Location: [facility] ORS;  Service: Urology;  Laterality: Left;   Social History:  reports that she has never smoked. She has never been exposed to tobacco smoke. She has never used smokeless tobacco. She reports that she does not currently use alcohol. She reports current drug use. Drug: Marijuana.  No Known Allergies  History reviewed. No pertinent family  history.  Prior to Admission medications   Medication Sig Start Date End Date Taking? Authorizing Provider  amLODipine (NORVASC) 5 MG tablet Take 5 mg by mouth daily. 08/31/22   [provider]  atorvastatin (LIPITOR) 80 MG tablet Take 80 mg by mouth daily. 08/31/22   [provider]  Cholecalciferol 50 MCG (2000 UT) TABS Take 1 tablet by mouth daily. 08/31/22   [provider]  dimenhyDRINATE (DRAMAMINE) 50 MG tablet Take 50 mg by mouth every 8 (eight) hours as needed for nausea.    [provider]  ibuprofen (ADVIL) 200 MG tablet Take 200 mg by mouth every 6 (six) hours as needed for mild pain.    [provider]  irbesartan (AVAPRO) 300 MG tablet Take 300 mg by mouth daily. 08/31/22   [provider]  ondansetron  (ZOFRAN -ODT) 4 MG disintegrating tablet Take 1 tablet (4 mg total) by mouth every 8 (eight) hours as needed for nausea or vomiting. 08/27/23   Veta Palma, PA-C  oxyCODONE  (ROXICODONE ) 5 MG immediate release tablet Take 1 tablet (5 mg total) by mouth every 4 (four) hours as needed for severe pain (pain score 7-10). 04/08/23   Raford Lenis, MD  pantoprazole  (PROTONIX ) 40 MG tablet Take 1 tablet (40 mg total) by mouth daily. 04/08/23   Raford Lenis, MD  potassium chloride  (KLOR-CON ) 10 MEQ tablet Take 1 tablet (10 mEq total) by mouth daily. 08/27/23   Franaszek, Amanda, PA-C  potassium chloride  SA (KLOR-CON  M) 20 MEQ tablet Take 1 tablet (20 mEq total) by mouth 2 (two) times daily. 07/11/23   Raford Lenis, MD  promethazine  (  PHENERGAN ) 12.5 MG tablet Take 1 tablet (12.5 mg total) by mouth every 6 (six) hours as needed for nausea or vomiting. 12/29/22   Willo Dunnings, MD    Physical Exam: Vitals:   01/12/24 1346 01/12/24 1757 01/12/24 1938 01/12/24 2145  BP: (!) 139/98  116/84 (!) 176/65  Pulse: 82  80 83  Resp: (!) 23  20 18   Temp: 98 F (36.7 C) 97.9 F (36.6 C) 97.9 F (36.6 C) 98 F (36.7 C)  TempSrc:   Oral Oral  SpO2:  100%  100% 100%  Weight:       Constitutional: Acutely ill looking no distress NAD, calm, comfortable Eyes: PERRL, lids and conjunctivae normal ENMT: Mucous membranes are moist. Posterior pharynx clear of any exudate or lesions.Normal dentition.  Neck: normal, supple, no masses, no thyromegaly Respiratory: clear to auscultation bilaterally, no wheezing, no crackles. Normal respiratory effort. No accessory muscle use.  Cardiovascular: Regular rate and rhythm, no murmurs / rubs / gallops. No extremity edema. 2+ pedal pulses. No carotid bruits.  Abdomen: no tenderness, no masses palpated. No hepatosplenomegaly. Bowel sounds positive.  Musculoskeletal: Good range of motion, no joint swelling or tenderness, Skin: no rashes, lesions, ulcers. No induration Neurologic: CN 2-12 grossly intact. Sensation intact, DTR normal. Strength 5/5 in all 4.  Psychiatric: Normal judgment and insight. Alert and oriented x 3. Normal mood  Data Reviewed:  Blood pressure 176/65, pulse 55 respiratory 23.  Hemoglobin is 11.8 CO2 of 20, glucose 159.  Acute viral screen is negative for flu RSV and COVID.  Urinalysis negative.  Urine drug screen is positive for THC.  CT abdomen pelvis shows mild motion degraded study with no CT evidence of acute intra abdominal or pelvic abnormality.  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  Assessment and Plan:  #1 intractable nausea with vomiting: Patient also reported abdominal pain.  Most likely as a result of hyperemesis cannabinoid or simple gastroenteritis.  She went to [employer] and drank lemonade she said.  At this point we will admit the patient for symptomatic management.  Hydrate.  Treat with Zofran .  If no relief may use Phenergan .  Electrolytes appear to be stable so we will monitor.  #2 essential hypertension: Will resume home regimen if tolerated.  IV hydralazine as needed.  IV labetalol also available.  #3 hyperlipidemia: Resume statin if  tolerated.  #4 GERD: Will consider PPIs  #5 marijuana use: Patient denied heavy use.  She is positive.  Counseling provided    Advance Care Planning:   Code Status: Full Code   Consults: None  Family Communication: No family at bedside  Severity of Illness: The appropriate patient status for this patient is OBSERVATION. Observation status is judged to be reasonable and necessary in order to provide the required intensity of service to ensure the patient's safety. The patient's presenting symptoms, physical exam findings, and initial radiographic and laboratory data in the context of their medical condition is felt to place them at decreased risk for further clinical deterioration. Furthermore, it is anticipated that the patient will be medically stable for discharge from the hospital within 2 midnights of admission.   AuthorBETHA SIM KNOLL, MD 01/12/2024 10:05 PM  For on call review [email or web address].

## 2024-01-12 NOTE — ED Triage Notes (Signed)
 Pt endorses abd pain with n/v starting yesterday after drinking lemonade

## 2024-01-12 NOTE — ED Provider Notes (Signed)
 [place] EMERGENCY DEPARTMENT AT [facility] Provider Note   CSN: [id number] Arrival date & time: 01/12/24  9046     Patient presents with: Abdominal Pain   Kayla Haynes is a 68 y.o. female.  68 year old female presents ED with complaints of abdominal pain and vomiting.  Patient reports abdominal pain began last night and she went to [employer] to get a lemonade and said she vomited up the limited.  Patient denies any other vomiting since.  Abdominal pain is reported as mild.  Patient has tried Zofran  without relief.  Patient significant history of cholecystectomy and hysterectomy.  Patient advises this will happen frequently and she comes to the hospital and gets Phenergan  and fluids and feels much better.  Patient denies any other associated symptoms and is concerned that that she is going to miss her birthday tomorrow.     Prior to Admission medications   Medication Sig Start Date End Date Taking? Authorizing Provider  amLODipine (NORVASC) 5 MG tablet Take 5 mg by mouth daily. 08/31/22   [provider]  atorvastatin (LIPITOR) 80 MG tablet Take 80 mg by mouth daily. 08/31/22   [provider]  Cholecalciferol 50 MCG (2000 UT) TABS Take 1 tablet by mouth daily. 08/31/22   [provider]  dimenhyDRINATE (DRAMAMINE) 50 MG tablet Take 50 mg by mouth every 8 (eight) hours as needed for nausea.    [provider]  ibuprofen (ADVIL) 200 MG tablet Take 200 mg by mouth every 6 (six) hours as needed for mild pain.    [provider]  irbesartan (AVAPRO) 300 MG tablet Take 300 mg by mouth daily. 08/31/22   [provider]  ondansetron  (ZOFRAN -ODT) 4 MG disintegrating tablet Take 1 tablet (4 mg total) by mouth every 8 (eight) hours as needed for nausea or vomiting. 08/27/23   Veta Palma, PA-C  oxyCODONE  (ROXICODONE ) 5 MG immediate release tablet Take 1 tablet (5 mg total) by mouth every 4 (four) hours as needed for severe pain  (pain score 7-10). 04/08/23   Raford Lenis, MD  pantoprazole  (PROTONIX ) 40 MG tablet Take 1 tablet (40 mg total) by mouth daily. 04/08/23   Raford Lenis, MD  potassium chloride  (KLOR-CON ) 10 MEQ tablet Take 1 tablet (10 mEq total) by mouth daily. 08/27/23   Franaszek, Amanda, PA-C  potassium chloride  SA (KLOR-CON  M) 20 MEQ tablet Take 1 tablet (20 mEq total) by mouth 2 (two) times daily. 07/11/23   Raford Lenis, MD  promethazine  (PHENERGAN ) 12.5 MG tablet Take 1 tablet (12.5 mg total) by mouth every 6 (six) hours as needed for nausea or vomiting. 12/29/22   Willo Dunnings, MD    Allergies: Patient has no known allergies.    Review of Systems  Gastrointestinal:  Positive for abdominal pain, nausea and vomiting.  All other systems reviewed and are negative.   Updated Vital Signs BP (!) 139/98 (BP Location: Right Arm)   Pulse 82   Temp 97.9 F (36.6 C)   Resp (!) 23   Wt 57.6 kg   SpO2 100%   BMI 22.50 kg/m   Physical Exam Vitals and nursing note reviewed.  Constitutional:      Appearance: Normal appearance.  HENT:     Head: Normocephalic and atraumatic.     Nose: Nose normal.  Eyes:     Extraocular Movements: Extraocular movements intact.     Conjunctiva/sclera: Conjunctivae normal.     Pupils: Pupils are equal, round, and reactive to light.  Cardiovascular:  Rate and Rhythm: Normal rate.  Pulmonary:     Effort: Pulmonary effort is normal. No respiratory distress.     Breath sounds: Normal breath sounds.  Abdominal:     General: Abdomen is flat. Bowel sounds are normal. There is no distension.     Palpations: Abdomen is soft.     Tenderness: There is no abdominal tenderness. There is no right CVA tenderness, left CVA tenderness, guarding or rebound. Negative signs include Murphy's sign and McBurney's sign.  Musculoskeletal:        General: Normal range of motion.     Cervical back: Normal range of motion.  Skin:    General: Skin is warm.     Capillary Refill:  Capillary refill takes less than 2 seconds.  Neurological:     General: No focal deficit present.     Mental Status: She is alert.  Psychiatric:        Mood and Affect: Mood normal.        Behavior: Behavior normal.     (all labs ordered are listed, but only abnormal results are displayed) Labs Reviewed  CBC WITH DIFFERENTIAL/PLATELET - Abnormal; Notable for the following components:      Result Value   Hemoglobin 11.8 (*)    MCV 74.2 (*)    MCH 23.7 (*)    All other components within normal limits  COMPREHENSIVE METABOLIC PANEL WITH GFR - Abnormal; Notable for the following components:   Chloride 96 (*)    Glucose, Bld 119 (*)    Calcium 11.2 (*)    Total Protein 8.3 (*)    Albumin 5.1 (*)    Anion gap 18 (*)    All other components within normal limits  BASIC METABOLIC PANEL WITH GFR - Abnormal; Notable for the following components:   CO2 20 (*)    Glucose, Bld 159 (*)    Anion gap 20 (*)    All other components within normal limits  RESP PANEL BY RT-PCR (RSV, FLU A&B, COVID)  RVPGX2  LIPASE, BLOOD  URINALYSIS, ROUTINE W REFLEX MICROSCOPIC  URINE DRUG SCREEN    EKG: EKG Interpretation Date/Time:  Thursday January 12 2024 10:49:49 EDT Ventricular Rate:  55 PR Interval:  176 QRS Duration:  117 QT Interval:  434 QTC Calculation: 416 R Axis:   103  Text Interpretation: Sinus rhythm Nonspecific intraventricular conduction delay Nonspecific repol abnormality, diffuse leads No significant change since last tracing Confirmed by Rogelia Satterfield ([id number]) on 01/12/2024 11:00:21 AM  Radiology: CT ABDOMEN PELVIS W CONTRAST Result Date: 01/12/2024 CLINICAL DATA:  Abdomen pain nausea vomiting EXAM: CT ABDOMEN AND PELVIS WITH CONTRAST TECHNIQUE: Multidetector CT imaging of the abdomen and pelvis was performed using the standard protocol following bolus administration of intravenous contrast. RADIATION DOSE REDUCTION: This exam was performed according to the departmental  dose-optimization program which includes automated exposure control, adjustment of the mA and/or kV according to patient size and/or use of iterative reconstruction technique. CONTRAST:  80mL OMNIPAQUE  IOHEXOL  300 MG/ML  SOLN COMPARISON:  CT 08/27/2023, 07/11/2023, 04/08/2023 FINDINGS: Lower chest: Lung bases show no acute airspace disease. Atelectasis or scarring at the lingula and right middle lobe. Trace pericardial fluid. Hepatobiliary: Cholecystectomy. No biliary dilatation. No focal hepatic abnormality. Motion degradation Pancreas: Unremarkable. No pancreatic ductal dilatation or surrounding inflammatory changes. Spleen: Normal in size without focal abnormality. Adrenals/Urinary Tract: Thickened bilateral adrenal glands without dominant mass. Kidneys show no hydronephrosis. Cyst in the left kidney, no imaging follow-up is recommended. The bladder is  decompressed Stomach/Bowel: Stomach is nonenlarged. No dilated small bowel. Decompressed colon. No acute bowel wall thickening. Diverticular disease of the colon. Vascular/Lymphatic: Advanced aortoiliac atherosclerosis. No aneurysm. No suspicious lymph nodes. Reproductive: Status post hysterectomy. No adnexal masses. Other: Negative for pelvic effusion or free air Musculoskeletal: Degenerative changes of the spine. No acute or suspicious osseous abnormality IMPRESSION: 1. Mild motion degraded study. No CT evidence for acute intra-abdominal or pelvic abnormality allowing for motion. 2. 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 3. Aortic atherosclerosis. Aortic Atherosclerosis (ICD10-I70.0). Electronically Signed   By: Luke Bun M.D.   On: 01/12/2024 15:13    Procedures   Medications Ordered in the ED  sodium chloride  0.9 % bolus 500 mL (0 mLs Intravenous Stopped 01/12/24 1323)  promethazine  (PHENERGAN ) 6.25 mg/NS 50 mL IVPB (0 mg Intravenous Stopped 01/12/24 1323)  amLODipine (NORVASC) tablet 5 mg (5 mg Oral Given 01/12/24 1245)   promethazine  (PHENERGAN ) 25 MG/ML injection (25 mg  Given by Other 01/12/24 1125)  lactated ringers  bolus 1,000 mL (0 mLs Intravenous Stopped 01/12/24 1425)  hydrOXYzine (ATARAX) tablet 25 mg (25 mg Oral Given 01/12/24 1245)  ondansetron  (ZOFRAN ) injection 4 mg (4 mg Intravenous Given 01/12/24 1335)  morphine  (PF) 4 MG/ML injection 4 mg (4 mg Intravenous Given 01/12/24 1336)  iohexol  (OMNIPAQUE ) 300 MG/ML solution 100 mL (80 mLs Intravenous Contrast Given 01/12/24 1428)   68 y.o. female presents to the ED with complaints of nausea vomiting and abdominal pain, this involves an extensive number of treatment options, and is a complaint that carries with it a high risk of complications and morbidity.  The differential diagnosis includes appendicitis, peptic ulcer disease, colitis,viral illness, UTI, hydronephrosis, nephrolithiasis, diverticulitis, AAA, ACS, (Ddx)  On arrival pt is nontoxic, vitals hypertensive in triage. Exam significant for abdominal pain and active nausea  Additional history obtained from chart review patient is followed by for medical care.  I ordered medication phenegran, amlodipine, hydroxyzine, morphine , Zofran  for nausea, pain, anxiety  Lab Tests:  I Ordered, reviewed, and interpreted labs, which included: CMP, CBC, UA, lipase, BMP  Imaging Studies ordered:  I ordered imaging studies which included CT abdomen pelvis with contrast, I independently visualized and interpreted imaging which showed no acute abnormalities.  ED Course:   68 year old female presents ED with complaints of, nausea vomiting and abdominal pain.  Patient advises that last night she had some mild abdominal pain.  She has been eating some denies and went to [employer] got some limited.  Patient reports she threw up the lemonade.  Patient reports she has not eaten much since but has not thrown up again.  Patient reports abdominal pain has decreased as well.  She has plans for her birthday tomorrow and she is  concerned about the nausea.  Patient reports that this has happened in the past she has gotten Phenergan  with relief.  Patient reports she has had some loose stools for the past couple weeks.  No urinary symptoms.  On exam abdomen is not focally tender in any location.  There is no obvious rashes or swelling in the extremities or abdomen.  Lungs are clear to auscultation in all fields.  Patient denies chest pain or shortness of breath.  Patient does have Zofran  at home which she reports did not help.  Patient advised she has been prescribed Phenergan  in the past with relief.  Patient was found to have an elevated anion gap and was started on lactated Ringer 's after discussion with attending.  Patient was  reporting more nausea and was given a dose of Zofran  IV.  Patient reported that this helped.  After further discussion with attending it was advised to give the patient pain management and get a CT scan to rule out intra-abdominal etiologies.  CT scan was negative for anything acute.  CT scan did note diverticulosis and arthrosclerosis of the aorta.  Patient was able to tolerate p.o. fluids.  Upon reassessment patient is walking around the room very anxiously and going back and forth from the bed to standing.  Patient advises that she has anxiety and she does this frequently.  Patient advises this is not abnormal for her.  Patient reports that she has brought it up to the [facility] multiple times and they have not done anything for management.  Patient was advised that we would wait for repeat BMP and she would be discharged.  BMP resulted elevated anion gap of 20.  Patient reported she does not have nausea currently but still having some abdominal pain.  It was discussed with attending and advised to consult hospitalist for admission.  Patient agreed to treatment plan.   Dr. Sim was consulted and advised to do a COVID flu RSV panel and urine drug screen and he would admit for observation and management of nausea  and vomiting.  Portions of this note were generated with Scientist, clinical (histocompatibility and immunogenetics). Dictation errors may occur despite best attempts at proofreading.    Final diagnoses:  Nausea and vomiting, unspecified vomiting type    ED Discharge Orders     None          Myriam Fonda GORMAN DEVONNA 01/12/24 RETHA Rogelia Jerilynn GORMAN, MD 01/13/24 [phone]

## 2024-01-13 DIAGNOSIS — R112 Nausea with vomiting, unspecified: Secondary | ICD-10-CM | POA: Diagnosis not present

## 2024-01-13 MED ORDER — AMLODIPINE BESYLATE 5 MG PO TABS: 5.0000 mg | ORAL_TABLET | Freq: Every day | ORAL | 1 refills | Status: AC

## 2024-01-13 MED ORDER — IRBESARTAN 300 MG PO TABS: 300.0000 mg | ORAL_TABLET | Freq: Every day | ORAL | 1 refills | Status: AC

## 2024-01-13 NOTE — Plan of Care (Signed)

## 2024-01-13 NOTE — Discharge Summary (Signed)
 Physician Discharge Summary  Patient ID: Kayla Haynes MRN: [id number] DOB/AGE: [birth year] 66 y.o.  Admit date: 01/12/2024 Discharge date: 01/13/2024  Admission Diagnoses:  Discharge Diagnoses:  Principal Problem:   Intractable vomiting with nausea Active Problems:   Hypertension   Tobacco use disorder   Moderate tetrahydrocannabinol (THC) dependence (HCC)   Discharged Condition: stable  Hospital Course: Patient is a 68 year old female with past medical history significant for hypertension, hyperlipidemia, history of cholecystectomy and tetrahydrocannabinol use.  Patient was admitted with nausea, vomiting and abdominal pain.  Patient was admitted and managed supportively.  CT scan of the abdomen and pelvis without contrast was negative.  Patient has improved significantly.  Nausea and vomiting have resolved.  Abdominal pain has resolved.  Patient is eager to be discharged back home today.  Patient be discharged to the care of the primary care provider.  Intractable nausea vomiting: - Managed supportively. - Patient uses tetrahydrocannabinol. - Symptoms have resolved. - Patient is eager to be discharged back home.  Hypertensive urgency/accelerated hypertension: - In the setting of abdominal pain, nausea and vomiting. - Blood pressure was generally controlled. - Will resume amlodipine and irbesartan.  Abdominal pain: - Resolved. - Likely secondary to nausea and vomiting.  Hyperlipidemia: - Continue statin.  GERD: Tetrahydrocannabinol use: - Counseled to quit   Consults: None  Significant Diagnostic Studies:  -Urine toxicology was positive for tetrahydrocannabinol.  CT abdomen and pelvis with contrast revealed: 1. Mild motion degraded study. No CT evidence for acute intra-abdominal or pelvic abnormality allowing for motion. 2. 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 3. Aortic atherosclerosis.   Treatments: Patient was managed  supportively.  Discharge Exam: Blood pressure 130/77, pulse 69, temperature 98.7 F (37.1 C), temperature source Oral, resp. rate 16, weight 57.6 kg, SpO2 94%.   Disposition: Discharge disposition: 01-Home or Self Care       Discharge Instructions     Diet - low sodium heart healthy   Complete by: As directed    Increase activity slowly   Complete by: As directed       Allergies as of 01/13/2024   No Known Allergies      Medication List     STOP taking these medications    Cholecalciferol 50 MCG (2000 UT) Tabs   ondansetron  4 MG disintegrating tablet Commonly known as: ZOFRAN -ODT   oxyCODONE  5 MG immediate release tablet Commonly known as: Roxicodone    potassium chloride  10 MEQ tablet Commonly known as: KLOR-CON        TAKE these medications    amLODipine 5 MG tablet Commonly known as: NORVASC Take 1 tablet (5 mg total) by mouth daily.   atorvastatin 80 MG tablet Commonly known as: LIPITOR Take 80 mg by mouth daily.   irbesartan 300 MG tablet Commonly known as: AVAPRO Take 1 tablet (300 mg total) by mouth daily.        Follow-up Information     [facility].   Contact information: [address] [place] [address] [phone]                Time spent: 35 minutes.  SignedBETHA Leatrice LILLETTE Rosario 01/13/2024, 1:07 PM

## 2024-01-20 MED FILL — Ondansetron HCl Inj 4 MG/2ML (2 MG/ML): INTRAMUSCULAR | Qty: 2 | Status: AC

## 2024-01-20 MED FILL — Sodium Chloride Flush IV Soln 0.9%: INTRAVENOUS | Qty: 10 | Status: AC

## 2024-04-01 ENCOUNTER — Emergency Department (HOSPITAL_BASED_OUTPATIENT_CLINIC_OR_DEPARTMENT_OTHER)
Admission: EM | Admit: 2024-04-01 | Discharge: 2024-04-01 | Disposition: A | Discharge status: Home / Self Care | Attending: Emergency Medicine | Admitting: Emergency Medicine

## 2024-04-01 ENCOUNTER — Encounter (HOSPITAL_BASED_OUTPATIENT_CLINIC_OR_DEPARTMENT_OTHER): Payer: Self-pay

## 2024-04-01 ENCOUNTER — Other Ambulatory Visit: Payer: Self-pay

## 2024-04-01 DIAGNOSIS — R112 Nausea with vomiting, unspecified: Secondary | ICD-10-CM | POA: Insufficient documentation

## 2024-04-01 DIAGNOSIS — Z79899 Other long term (current) drug therapy: Secondary | ICD-10-CM | POA: Diagnosis not present

## 2024-04-01 DIAGNOSIS — R109 Unspecified abdominal pain: Secondary | ICD-10-CM | POA: Diagnosis not present

## 2024-04-01 DIAGNOSIS — I10 Essential (primary) hypertension: Secondary | ICD-10-CM | POA: Insufficient documentation

## 2024-04-01 LAB — URINALYSIS, ROUTINE W REFLEX MICROSCOPIC
Bilirubin Urine: NEGATIVE
Glucose, UA: NEGATIVE mg/dL
Hgb urine dipstick: NEGATIVE
Ketones, ur: 80 mg/dL — AB
Leukocytes,Ua: NEGATIVE
Nitrite: NEGATIVE
Protein, ur: 30 mg/dL — AB
Specific Gravity, Urine: 1.025 (ref 1.005–1.030)
pH: 5.5 (ref 5.0–8.0)

## 2024-04-01 LAB — COMPREHENSIVE METABOLIC PANEL WITH GFR
ALT: 16 U/L (ref 0–44)
AST: 25 U/L (ref 15–41)
Albumin: 5 g/dL (ref 3.5–5.0)
Alkaline Phosphatase: 115 U/L (ref 38–126)
Anion gap: 22 — ABNORMAL HIGH (ref 5–15)
BUN: 17 mg/dL (ref 8–23)
CO2: 19 mmol/L — ABNORMAL LOW (ref 22–32)
Calcium: 10.6 mg/dL — ABNORMAL HIGH (ref 8.9–10.3)
Chloride: 97 mmol/L — ABNORMAL LOW (ref 98–111)
Creatinine, Ser: 0.87 mg/dL (ref 0.44–1.00)
GFR, Estimated: >60 mL/min
Glucose, Bld: 207 mg/dL — ABNORMAL HIGH (ref 70–99)
Potassium: 3.6 mmol/L (ref 3.5–5.1)
Sodium: 138 mmol/L (ref 135–145)
Total Bilirubin: 0.7 mg/dL (ref 0.0–1.2)
Total Protein: 7.9 g/dL (ref 6.5–8.1)

## 2024-04-01 LAB — LIPASE, BLOOD: Lipase: 20 U/L (ref 11–51)

## 2024-04-01 LAB — CBC
HCT: 36 % (ref 36.0–46.0)
Hemoglobin: 11.8 g/dL — ABNORMAL LOW (ref 12.0–15.0)
MCH: 24.2 pg — ABNORMAL LOW (ref 26.0–34.0)
MCHC: 32.8 g/dL (ref 30.0–36.0)
MCV: 73.8 fL — ABNORMAL LOW (ref 80.0–100.0)
Platelets: 397 K/uL (ref 150–400)
RBC: 4.88 MIL/uL (ref 3.87–5.11)
RDW: 13.9 % (ref 11.5–15.5)
WBC: 15.1 K/uL — ABNORMAL HIGH (ref 4.0–10.5)
nRBC: 0 % (ref 0.0–0.2)

## 2024-04-01 LAB — URINALYSIS, MICROSCOPIC (REFLEX)

## 2024-04-01 MED ORDER — ONDANSETRON HCL 4 MG/2ML IJ SOLN
4.0000 mg | Freq: Once | INTRAMUSCULAR | Status: AC
  Administered 2024-04-01: 4 mg via INTRAVENOUS
  Filled 2024-04-01: qty 2

## 2024-04-01 MED ORDER — KETOROLAC TROMETHAMINE 15 MG/ML IJ SOLN
15.0000 mg | Freq: Once | INTRAMUSCULAR | Status: AC
  Administered 2024-04-01: 15 mg via INTRAVENOUS
  Filled 2024-04-01: qty 1

## 2024-04-01 MED ORDER — SODIUM CHLORIDE 0.9 % IV BOLUS
1000.0000 mL | Freq: Once | INTRAVENOUS | Status: AC
  Administered 2024-04-01: 1000 mL via INTRAVENOUS

## 2024-04-01 MED ORDER — MORPHINE SULFATE (PF) 4 MG/ML IV SOLN
4.0000 mg | Freq: Once | INTRAVENOUS | Status: AC
  Administered 2024-04-01: 4 mg via INTRAVENOUS
  Filled 2024-04-01: qty 1

## 2024-04-01 MED ORDER — LORAZEPAM 2 MG/ML IJ SOLN
1.0000 mg | Freq: Once | INTRAMUSCULAR | Status: AC
  Administered 2024-04-01: 1 mg via INTRAVENOUS
  Filled 2024-04-01: qty 1

## 2024-04-01 NOTE — ED Notes (Signed)
 Pt called out stating she vomited water she had been drinking

## 2024-04-01 NOTE — ED Notes (Signed)
 Pt continues to rock back and forth in bed. Gets out of beds frequently to walk in place

## 2024-04-01 NOTE — ED Triage Notes (Signed)
 Pt reports she ate hamburger last night at 7pm when abdominal pain began. Last smoked marijuana 5 pm. Pt restless, rocking bad and forth. Up and down from bed. Nauseated

## 2024-04-01 NOTE — Discharge Instructions (Signed)
 Refrain from using marijuana.  Make an appointment to follow-up with your gastroenterologist.  Return to the emergency room if you have any worsening symptoms.

## 2024-04-01 NOTE — ED Notes (Signed)
 Attempted IV access to RAC and RFA, unsuccessful, blood obtained for labs. Tol well

## 2024-04-01 NOTE — ED Notes (Signed)
 No further vomiting and pain resolved. Reviewed discharge instructions and follow up. States understanding. Ambulatory to lobby. Daughter to transport home

## 2024-04-01 NOTE — ED Notes (Addendum)
 Pt unable to sit still. In and out of bed. Walking in place. Unable to get an accurate BP reading due to continual movement

## 2024-04-01 NOTE — ED Provider Notes (Signed)
 " [facility] Provider Note   CSN: [id number] Arrival date & time: 04/01/24  0700     Patient presents with: Abdominal Pain   Kayla Haynes is a 68 y.o. female.   Patient is a 68 year old female with a history of hypertension and hyperlipidemia who presents with abdominal pain associated with nausea and vomiting.  She has had recurrent episodes of mid abdominal pain associated with nausea and vomiting over the last couple of years.  She says she was supposed to see a GI doctor for the first time yesterday at the [facility] but her car broke down and was unable to get there.  She does smoke marijuana.  She denies any alcohol use.  She is status post cholecystectomy multiple years ago.  She said the pain started back again during the night after she ate a hamburger yesterday.  She has had nonbloody, nonbilious emesis.  No change in her stools other than she thinks it is a different color brown than it normally is.  No fevers.  No urinary symptoms.  She said the pain is similar to her prior episodes.       Prior to Admission medications  Medication Sig Start Date End Date Taking? Authorizing Provider  amLODipine  (NORVASC ) 5 MG tablet Take 1 tablet (5 mg total) by mouth daily. 01/13/24   Rosario Leatrice FERNS, MD  atorvastatin  (LIPITOR) 80 MG tablet Take 80 mg by mouth daily. 08/31/22   [provider]  irbesartan  (AVAPRO ) 300 MG tablet Take 1 tablet (300 mg total) by mouth daily. 01/13/24   Rosario Leatrice FERNS, MD    Allergies: Patient has no known allergies.    Review of Systems  Constitutional:  Negative for chills, diaphoresis, fatigue and fever.  HENT:  Negative for congestion, rhinorrhea and sneezing.   Eyes: Negative.   Respiratory:  Negative for cough, chest tightness and shortness of breath.   Cardiovascular:  Negative for chest pain and leg swelling.  Gastrointestinal:  Positive for abdominal pain, nausea and vomiting.  Negative for blood in stool and diarrhea.  Genitourinary:  Negative for difficulty urinating, flank pain and frequency.  Musculoskeletal:  Negative for arthralgias and back pain.  Skin:  Negative for rash.  Neurological:  Negative for dizziness, speech difficulty, weakness, numbness and headaches.    Updated Vital Signs BP (!) 113/57   Pulse 78   Temp 98.4 F (36.9 C) (Oral)   Resp 16   Wt 61.2 kg   SpO2 93%   BMI 23.91 kg/m   Physical Exam Constitutional:      Appearance: She is well-developed.  HENT:     Head: Normocephalic and atraumatic.  Eyes:     Pupils: Pupils are equal, round, and reactive to light.  Cardiovascular:     Rate and Rhythm: Normal rate and regular rhythm.     Heart sounds: Normal heart sounds.  Pulmonary:     Effort: Pulmonary effort is normal. No respiratory distress.     Breath sounds: Normal breath sounds. No wheezing or rales.  Chest:     Chest wall: No tenderness.  Abdominal:     General: Bowel sounds are normal.     Palpations: Abdomen is soft.     Tenderness: There is abdominal tenderness in the periumbilical area. There is no guarding or rebound.  Musculoskeletal:        General: Normal range of motion.     Cervical back: Normal range of motion and neck  supple.  Lymphadenopathy:     Cervical: No cervical adenopathy.  Skin:    General: Skin is warm and dry.     Findings: No rash.  Neurological:     Mental Status: She is alert and oriented to person, place, and time.     (all labs ordered are listed, but only abnormal results are displayed) Labs Reviewed  COMPREHENSIVE METABOLIC PANEL WITH GFR - Abnormal; Notable for the following components:      Result Value   Chloride 97 (*)    CO2 19 (*)    Glucose, Bld 207 (*)    Calcium  10.6 (*)    Anion gap 22 (*)    All other components within normal limits  CBC - Abnormal; Notable for the following components:   WBC 15.1 (*)    Hemoglobin 11.8 (*)    MCV 73.8 (*)    MCH 24.2 (*)     All other components within normal limits  URINALYSIS, ROUTINE W REFLEX MICROSCOPIC - Abnormal; Notable for the following components:   Ketones, ur 80 (*)    Protein, ur 30 (*)    All other components within normal limits  URINALYSIS, MICROSCOPIC (REFLEX) - Abnormal; Notable for the following components:   Bacteria, UA FEW (*)    All other components within normal limits  LIPASE, BLOOD    EKG: EKG Interpretation Date/Time:  Sunday April 01 2024 07:36:03 EST Ventricular Rate:  54 PR Interval:  151 QRS Duration:  103 QT Interval:  455 QTC Calculation: 432 R Axis:   99  Text Interpretation: Sinus rhythm Right axis deviation Baseline wander in lead(s) III aVL aVF since last tracing no significant change Confirmed by Lenor Hollering [phone]) on 04/01/2024 8:27:21 AM  Radiology: No results found.   Procedures   Medications Ordered in the ED  morphine  (PF) 4 MG/ML injection 4 mg (4 mg Intravenous Given 04/01/24 0759)  sodium chloride  0.9 % bolus 1,000 mL (0 mLs Intravenous Stopped 04/01/24 0909)  ondansetron  (ZOFRAN ) injection 4 mg (4 mg Intravenous Given 04/01/24 0801)  ketorolac  (TORADOL ) 15 MG/ML injection 15 mg (15 mg Intravenous Given 04/01/24 0846)  morphine  (PF) 4 MG/ML injection 4 mg (4 mg Intravenous Given 04/01/24 1206)  LORazepam  (ATIVAN ) injection 1 mg (1 mg Intravenous Given 04/01/24 1325)                                    Medical Decision Making Amount and/or Complexity of Data Reviewed Labs: ordered.  Risk Prescription drug management.   This patient presents to the ED for concern of abdominal pain, this involves an extensive number of treatment options, and is a complaint that carries with it a high risk of complications and morbidity.  I considered the following differential and admission for this acute, potentially life threatening condition.  The differential diagnosis includes cyclical vomiting syndrome, mesenteric ischemia, pancreatitis, bowel  obstruction, colitis  MDM:    Patient is a 68 year old who presents with recurrent episode of nausea and vomiting associate with abdominal pain.  She has had similar symptoms in the past.  On chart review, she has had 4 CT scans this year for similar symptoms.  She did have a CTA in May of this year at an outside facility due to similar symptoms.  She had some atherosclerotic disease but her mesenteric arteries were patent.  Given this, I do not feel that repeat CT imaging is indicated  at this time.  Her labs show an elevated WBC count which she has had on her previous visits.  She also has a little bit of an acidosis/anion gap.  Suspect this is from her ongoing vomiting.  She was given IV fluids, antiemetics and pain medications.  Ultimately she was feeling better but still was having some abdominal pain.  I did discuss with her admission to the hospital but she is adamant about not wanting to be admitted.  She says she is ready to go home.  She was having a lot of anxiety and requested something for anxiety.  Was given a dose of Ativan .  She still does not want to be admitted to the hospital.  She has not had any ongoing vomiting.  She was discharged home in good condition.  She was encouraged to follow-up with her gastroenterologist.  Return precautions were given.  I did have a discussion with her about stopping marijuana usage which potentially could be causing the cyclical vomiting episodes however she does need an evaluation by gastroenterologist which I also discussed with her.  She had an appointment yesterday but missed her appointment due to car troubles.  (Labs, imaging, consults)  Labs: I Ordered, and personally interpreted labs.  The pertinent results include: Elevated WBC count, mild anion gap, slight acidosis  Imaging Studies ordered: I ordered imaging studies including CT abdomen pelvis I independently visualized and interpreted imaging. I agree with the radiologist  interpretation  Additional history obtained from  .  External records from outside source obtained and reviewed including chart review, prior ED notes  Cardiac Monitoring: The patient was maintained on a cardiac monitor.  If on the cardiac monitor, I personally viewed and interpreted the cardiac monitored which showed an underlying rhythm of: Sinus rhythm  Reevaluation: After the interventions noted above, I reevaluated the patient and found that they have :improved  Social Determinants of Health:  marijuana use  Disposition: Discharged to home  Co morbidities that complicate the patient evaluation  Past Medical History:  Diagnosis Date   High cholesterol    Hypertension      Medicines Meds ordered this encounter  Medications   morphine  (PF) 4 MG/ML injection 4 mg   sodium chloride  0.9 % bolus 1,000 mL   ondansetron  (ZOFRAN ) injection 4 mg   ketorolac  (TORADOL ) 15 MG/ML injection 15 mg   morphine  (PF) 4 MG/ML injection 4 mg   LORazepam  (ATIVAN ) injection 1 mg    I have reviewed the patients home medicines and have made adjustments as needed  Problem List / ED Course: Problem List Items Addressed This Visit   None Visit Diagnoses       Abdominal pain, unspecified abdominal location    -  Primary     Nausea and vomiting, unspecified vomiting type                    Final diagnoses:  Abdominal pain, unspecified abdominal location  Nausea and vomiting, unspecified vomiting type    ED Discharge Orders     None          Lenor Hollering, MD 04/01/24 1455  "

## 2024-04-01 NOTE — ED Notes (Signed)
 Ask pt if she can go to the bathroom so we can get a urine sample. Pt stated she doesn't have to go right now.

## 2024-04-01 NOTE — ED Notes (Signed)
 Pt failed PO challenge. Vomited water after drinking
